# Patient Record
Sex: Female | Born: 1950
Health system: Southern US, Community
[De-identification: ages and names within clinical notes are randomized; demographics above are authoritative.]

## PROBLEM LIST (undated history)

## (undated) DIAGNOSIS — H9191 Unspecified hearing loss, right ear: Secondary | ICD-10-CM

## (undated) DIAGNOSIS — D333 Benign neoplasm of cranial nerves: Secondary | ICD-10-CM

## (undated) DIAGNOSIS — E079 Disorder of thyroid, unspecified: Secondary | ICD-10-CM

---

## 2014-04-02 DIAGNOSIS — H18832 Recurrent erosion of cornea, left eye: Secondary | ICD-10-CM | POA: Diagnosis not present

## 2014-04-04 DIAGNOSIS — H18832 Recurrent erosion of cornea, left eye: Secondary | ICD-10-CM | POA: Diagnosis not present

## 2014-04-06 DIAGNOSIS — H18832 Recurrent erosion of cornea, left eye: Secondary | ICD-10-CM | POA: Diagnosis not present

## 2014-04-09 DIAGNOSIS — Z85828 Personal history of other malignant neoplasm of skin: Secondary | ICD-10-CM | POA: Diagnosis not present

## 2014-04-09 DIAGNOSIS — L570 Actinic keratosis: Secondary | ICD-10-CM | POA: Diagnosis not present

## 2014-06-26 DIAGNOSIS — Z1231 Encounter for screening mammogram for malignant neoplasm of breast: Secondary | ICD-10-CM | POA: Diagnosis not present

## 2014-06-29 DIAGNOSIS — D333 Benign neoplasm of cranial nerves: Secondary | ICD-10-CM | POA: Diagnosis not present

## 2014-06-29 DIAGNOSIS — Z923 Personal history of irradiation: Secondary | ICD-10-CM | POA: Diagnosis not present

## 2014-09-20 DIAGNOSIS — E039 Hypothyroidism, unspecified: Secondary | ICD-10-CM | POA: Diagnosis not present

## 2014-09-20 DIAGNOSIS — E781 Pure hyperglyceridemia: Secondary | ICD-10-CM | POA: Diagnosis not present

## 2014-09-27 DIAGNOSIS — E039 Hypothyroidism, unspecified: Secondary | ICD-10-CM | POA: Diagnosis not present

## 2014-09-27 DIAGNOSIS — E781 Pure hyperglyceridemia: Secondary | ICD-10-CM | POA: Diagnosis not present

## 2014-09-27 DIAGNOSIS — F411 Generalized anxiety disorder: Secondary | ICD-10-CM | POA: Diagnosis not present

## 2014-10-09 DIAGNOSIS — D225 Melanocytic nevi of trunk: Secondary | ICD-10-CM | POA: Diagnosis not present

## 2014-10-09 DIAGNOSIS — L570 Actinic keratosis: Secondary | ICD-10-CM | POA: Diagnosis not present

## 2014-10-09 DIAGNOSIS — D485 Neoplasm of uncertain behavior of skin: Secondary | ICD-10-CM | POA: Diagnosis not present

## 2014-10-09 DIAGNOSIS — Z85828 Personal history of other malignant neoplasm of skin: Secondary | ICD-10-CM | POA: Diagnosis not present

## 2014-10-09 DIAGNOSIS — L400 Psoriasis vulgaris: Secondary | ICD-10-CM | POA: Diagnosis not present

## 2014-11-12 DIAGNOSIS — H2513 Age-related nuclear cataract, bilateral: Secondary | ICD-10-CM | POA: Diagnosis not present

## 2015-01-03 DIAGNOSIS — H2513 Age-related nuclear cataract, bilateral: Secondary | ICD-10-CM | POA: Diagnosis not present

## 2015-01-11 DIAGNOSIS — H2511 Age-related nuclear cataract, right eye: Secondary | ICD-10-CM | POA: Diagnosis not present

## 2015-02-05 DIAGNOSIS — H2511 Age-related nuclear cataract, right eye: Secondary | ICD-10-CM | POA: Diagnosis not present

## 2015-02-07 DIAGNOSIS — H2512 Age-related nuclear cataract, left eye: Secondary | ICD-10-CM | POA: Diagnosis not present

## 2015-02-07 DIAGNOSIS — H2511 Age-related nuclear cataract, right eye: Secondary | ICD-10-CM | POA: Diagnosis not present

## 2015-02-14 DIAGNOSIS — H2512 Age-related nuclear cataract, left eye: Secondary | ICD-10-CM | POA: Diagnosis not present

## 2015-02-28 DIAGNOSIS — H2512 Age-related nuclear cataract, left eye: Secondary | ICD-10-CM | POA: Diagnosis not present

## 2015-03-13 DIAGNOSIS — E039 Hypothyroidism, unspecified: Secondary | ICD-10-CM | POA: Diagnosis not present

## 2015-04-09 DIAGNOSIS — L400 Psoriasis vulgaris: Secondary | ICD-10-CM | POA: Diagnosis not present

## 2015-04-09 DIAGNOSIS — Z85828 Personal history of other malignant neoplasm of skin: Secondary | ICD-10-CM | POA: Diagnosis not present

## 2015-04-09 DIAGNOSIS — L570 Actinic keratosis: Secondary | ICD-10-CM | POA: Diagnosis not present

## 2015-05-08 DIAGNOSIS — H26491 Other secondary cataract, right eye: Secondary | ICD-10-CM | POA: Diagnosis not present

## 2015-06-10 DIAGNOSIS — H43391 Other vitreous opacities, right eye: Secondary | ICD-10-CM | POA: Diagnosis not present

## 2015-06-27 DIAGNOSIS — Z1231 Encounter for screening mammogram for malignant neoplasm of breast: Secondary | ICD-10-CM | POA: Diagnosis not present

## 2015-08-08 DIAGNOSIS — H43391 Other vitreous opacities, right eye: Secondary | ICD-10-CM | POA: Diagnosis not present

## 2015-09-19 DIAGNOSIS — H43391 Other vitreous opacities, right eye: Secondary | ICD-10-CM | POA: Diagnosis not present

## 2015-11-26 DIAGNOSIS — H43391 Other vitreous opacities, right eye: Secondary | ICD-10-CM | POA: Diagnosis not present

## 2016-01-23 DIAGNOSIS — Z23 Encounter for immunization: Secondary | ICD-10-CM | POA: Diagnosis not present

## 2016-03-18 DIAGNOSIS — E039 Hypothyroidism, unspecified: Secondary | ICD-10-CM | POA: Diagnosis not present

## 2016-03-18 DIAGNOSIS — E781 Pure hyperglyceridemia: Secondary | ICD-10-CM | POA: Diagnosis not present

## 2016-03-25 DIAGNOSIS — E781 Pure hyperglyceridemia: Secondary | ICD-10-CM | POA: Diagnosis not present

## 2016-03-25 DIAGNOSIS — Z6830 Body mass index (BMI) 30.0-30.9, adult: Secondary | ICD-10-CM | POA: Diagnosis not present

## 2016-03-25 DIAGNOSIS — E039 Hypothyroidism, unspecified: Secondary | ICD-10-CM | POA: Diagnosis not present

## 2016-03-25 DIAGNOSIS — F411 Generalized anxiety disorder: Secondary | ICD-10-CM | POA: Diagnosis not present

## 2016-04-06 DIAGNOSIS — H26492 Other secondary cataract, left eye: Secondary | ICD-10-CM | POA: Diagnosis not present

## 2016-04-06 DIAGNOSIS — H04123 Dry eye syndrome of bilateral lacrimal glands: Secondary | ICD-10-CM | POA: Diagnosis not present

## 2016-04-08 DIAGNOSIS — D225 Melanocytic nevi of trunk: Secondary | ICD-10-CM | POA: Diagnosis not present

## 2016-04-08 DIAGNOSIS — L400 Psoriasis vulgaris: Secondary | ICD-10-CM | POA: Diagnosis not present

## 2016-04-08 DIAGNOSIS — D485 Neoplasm of uncertain behavior of skin: Secondary | ICD-10-CM | POA: Diagnosis not present

## 2016-04-08 DIAGNOSIS — Z85828 Personal history of other malignant neoplasm of skin: Secondary | ICD-10-CM | POA: Diagnosis not present

## 2016-04-08 DIAGNOSIS — L570 Actinic keratosis: Secondary | ICD-10-CM | POA: Diagnosis not present

## 2016-04-14 DIAGNOSIS — H43391 Other vitreous opacities, right eye: Secondary | ICD-10-CM | POA: Diagnosis not present

## 2016-04-24 DIAGNOSIS — H26492 Other secondary cataract, left eye: Secondary | ICD-10-CM | POA: Diagnosis not present

## 2016-06-23 DIAGNOSIS — M79671 Pain in right foot: Secondary | ICD-10-CM | POA: Diagnosis not present

## 2016-06-23 DIAGNOSIS — M2041 Other hammer toe(s) (acquired), right foot: Secondary | ICD-10-CM | POA: Diagnosis not present

## 2016-06-23 DIAGNOSIS — M25579 Pain in unspecified ankle and joints of unspecified foot: Secondary | ICD-10-CM | POA: Diagnosis not present

## 2016-07-03 DIAGNOSIS — D333 Benign neoplasm of cranial nerves: Secondary | ICD-10-CM | POA: Diagnosis not present

## 2016-07-09 DIAGNOSIS — Z1231 Encounter for screening mammogram for malignant neoplasm of breast: Secondary | ICD-10-CM | POA: Diagnosis not present

## 2016-09-14 DIAGNOSIS — E039 Hypothyroidism, unspecified: Secondary | ICD-10-CM | POA: Diagnosis not present

## 2016-09-17 DIAGNOSIS — Z23 Encounter for immunization: Secondary | ICD-10-CM | POA: Diagnosis not present

## 2016-09-17 DIAGNOSIS — Z6830 Body mass index (BMI) 30.0-30.9, adult: Secondary | ICD-10-CM | POA: Diagnosis not present

## 2016-09-17 DIAGNOSIS — E039 Hypothyroidism, unspecified: Secondary | ICD-10-CM | POA: Diagnosis not present

## 2016-09-17 DIAGNOSIS — E781 Pure hyperglyceridemia: Secondary | ICD-10-CM | POA: Diagnosis not present

## 2016-09-17 DIAGNOSIS — Z0000 Encounter for general adult medical examination without abnormal findings: Secondary | ICD-10-CM | POA: Diagnosis not present

## 2016-09-17 DIAGNOSIS — F411 Generalized anxiety disorder: Secondary | ICD-10-CM | POA: Diagnosis not present

## 2016-10-09 DIAGNOSIS — Z1211 Encounter for screening for malignant neoplasm of colon: Secondary | ICD-10-CM | POA: Diagnosis not present

## 2016-10-09 DIAGNOSIS — K5730 Diverticulosis of large intestine without perforation or abscess without bleeding: Secondary | ICD-10-CM | POA: Diagnosis not present

## 2017-02-11 DIAGNOSIS — R05 Cough: Secondary | ICD-10-CM | POA: Diagnosis not present

## 2017-02-11 DIAGNOSIS — J189 Pneumonia, unspecified organism: Secondary | ICD-10-CM | POA: Diagnosis not present

## 2017-02-11 DIAGNOSIS — R062 Wheezing: Secondary | ICD-10-CM | POA: Diagnosis not present

## 2017-02-11 DIAGNOSIS — R0602 Shortness of breath: Secondary | ICD-10-CM | POA: Diagnosis not present

## 2017-02-11 DIAGNOSIS — R509 Fever, unspecified: Secondary | ICD-10-CM | POA: Diagnosis not present

## 2017-02-12 DIAGNOSIS — R062 Wheezing: Secondary | ICD-10-CM | POA: Diagnosis not present

## 2017-02-12 DIAGNOSIS — J189 Pneumonia, unspecified organism: Secondary | ICD-10-CM | POA: Diagnosis not present

## 2017-02-12 DIAGNOSIS — R509 Fever, unspecified: Secondary | ICD-10-CM | POA: Diagnosis not present

## 2017-03-12 DIAGNOSIS — E039 Hypothyroidism, unspecified: Secondary | ICD-10-CM | POA: Diagnosis not present

## 2017-03-12 DIAGNOSIS — F411 Generalized anxiety disorder: Secondary | ICD-10-CM | POA: Diagnosis not present

## 2017-03-12 DIAGNOSIS — E781 Pure hyperglyceridemia: Secondary | ICD-10-CM | POA: Diagnosis not present

## 2017-03-16 DIAGNOSIS — R739 Hyperglycemia, unspecified: Secondary | ICD-10-CM | POA: Diagnosis not present

## 2017-03-16 DIAGNOSIS — J189 Pneumonia, unspecified organism: Secondary | ICD-10-CM | POA: Diagnosis not present

## 2017-03-16 DIAGNOSIS — E039 Hypothyroidism, unspecified: Secondary | ICD-10-CM | POA: Diagnosis not present

## 2017-03-16 DIAGNOSIS — Z6830 Body mass index (BMI) 30.0-30.9, adult: Secondary | ICD-10-CM | POA: Diagnosis not present

## 2017-03-16 DIAGNOSIS — E781 Pure hyperglyceridemia: Secondary | ICD-10-CM | POA: Diagnosis not present

## 2017-03-16 DIAGNOSIS — F411 Generalized anxiety disorder: Secondary | ICD-10-CM | POA: Diagnosis not present

## 2017-03-16 DIAGNOSIS — Z23 Encounter for immunization: Secondary | ICD-10-CM | POA: Diagnosis not present

## 2017-03-16 DIAGNOSIS — M7551 Bursitis of right shoulder: Secondary | ICD-10-CM | POA: Diagnosis not present

## 2017-04-07 DIAGNOSIS — D485 Neoplasm of uncertain behavior of skin: Secondary | ICD-10-CM | POA: Diagnosis not present

## 2017-04-07 DIAGNOSIS — L821 Other seborrheic keratosis: Secondary | ICD-10-CM | POA: Diagnosis not present

## 2017-04-07 DIAGNOSIS — Z85828 Personal history of other malignant neoplasm of skin: Secondary | ICD-10-CM | POA: Diagnosis not present

## 2017-04-07 DIAGNOSIS — D2362 Other benign neoplasm of skin of left upper limb, including shoulder: Secondary | ICD-10-CM | POA: Diagnosis not present

## 2017-04-07 DIAGNOSIS — L400 Psoriasis vulgaris: Secondary | ICD-10-CM | POA: Diagnosis not present

## 2017-04-07 DIAGNOSIS — L570 Actinic keratosis: Secondary | ICD-10-CM | POA: Diagnosis not present

## 2017-04-07 DIAGNOSIS — L432 Lichenoid drug reaction: Secondary | ICD-10-CM | POA: Diagnosis not present

## 2017-05-06 DIAGNOSIS — Z961 Presence of intraocular lens: Secondary | ICD-10-CM | POA: Diagnosis not present

## 2017-07-13 DIAGNOSIS — Z1231 Encounter for screening mammogram for malignant neoplasm of breast: Secondary | ICD-10-CM | POA: Diagnosis not present

## 2017-09-10 DIAGNOSIS — F411 Generalized anxiety disorder: Secondary | ICD-10-CM | POA: Diagnosis not present

## 2017-09-10 DIAGNOSIS — E781 Pure hyperglyceridemia: Secondary | ICD-10-CM | POA: Diagnosis not present

## 2017-09-10 DIAGNOSIS — R739 Hyperglycemia, unspecified: Secondary | ICD-10-CM | POA: Diagnosis not present

## 2017-09-10 DIAGNOSIS — E039 Hypothyroidism, unspecified: Secondary | ICD-10-CM | POA: Diagnosis not present

## 2017-10-12 DIAGNOSIS — Z85820 Personal history of malignant melanoma of skin: Secondary | ICD-10-CM | POA: Diagnosis not present

## 2017-10-12 DIAGNOSIS — L570 Actinic keratosis: Secondary | ICD-10-CM | POA: Diagnosis not present

## 2017-10-12 DIAGNOSIS — L409 Psoriasis, unspecified: Secondary | ICD-10-CM | POA: Diagnosis not present

## 2017-10-13 DIAGNOSIS — E781 Pure hyperglyceridemia: Secondary | ICD-10-CM | POA: Diagnosis not present

## 2017-10-13 DIAGNOSIS — M7551 Bursitis of right shoulder: Secondary | ICD-10-CM | POA: Diagnosis not present

## 2017-10-13 DIAGNOSIS — R739 Hyperglycemia, unspecified: Secondary | ICD-10-CM | POA: Diagnosis not present

## 2017-10-13 DIAGNOSIS — E039 Hypothyroidism, unspecified: Secondary | ICD-10-CM | POA: Diagnosis not present

## 2017-10-13 DIAGNOSIS — Z6830 Body mass index (BMI) 30.0-30.9, adult: Secondary | ICD-10-CM | POA: Diagnosis not present

## 2017-10-13 DIAGNOSIS — F411 Generalized anxiety disorder: Secondary | ICD-10-CM | POA: Diagnosis not present

## 2017-10-13 DIAGNOSIS — Z0001 Encounter for general adult medical examination with abnormal findings: Secondary | ICD-10-CM | POA: Diagnosis not present

## 2018-03-10 ENCOUNTER — Emergency Department (HOSPITAL_COMMUNITY): Payer: Medicare Other

## 2018-03-10 ENCOUNTER — Emergency Department (HOSPITAL_COMMUNITY)
Admission: EM | Admit: 2018-03-10 | Discharge: 2018-03-10 | Disposition: A | Discharge status: Home / Self Care | Payer: Medicare Other | Attending: Emergency Medicine | Admitting: Emergency Medicine

## 2018-03-10 ENCOUNTER — Other Ambulatory Visit: Payer: Self-pay

## 2018-03-10 ENCOUNTER — Encounter (HOSPITAL_COMMUNITY): Payer: Self-pay | Admitting: Emergency Medicine

## 2018-03-10 DIAGNOSIS — E039 Hypothyroidism, unspecified: Secondary | ICD-10-CM | POA: Insufficient documentation

## 2018-03-10 DIAGNOSIS — K760 Fatty (change of) liver, not elsewhere classified: Secondary | ICD-10-CM | POA: Diagnosis not present

## 2018-03-10 DIAGNOSIS — R079 Chest pain, unspecified: Secondary | ICD-10-CM | POA: Diagnosis not present

## 2018-03-10 DIAGNOSIS — R1013 Epigastric pain: Secondary | ICD-10-CM | POA: Insufficient documentation

## 2018-03-10 DIAGNOSIS — R0789 Other chest pain: Secondary | ICD-10-CM | POA: Insufficient documentation

## 2018-03-10 DIAGNOSIS — Z79899 Other long term (current) drug therapy: Secondary | ICD-10-CM | POA: Diagnosis not present

## 2018-03-10 DIAGNOSIS — K8020 Calculus of gallbladder without cholecystitis without obstruction: Secondary | ICD-10-CM | POA: Insufficient documentation

## 2018-03-10 DIAGNOSIS — R05 Cough: Secondary | ICD-10-CM | POA: Diagnosis not present

## 2018-03-10 HISTORY — DX: Unspecified hearing loss, right ear: H91.91

## 2018-03-10 HISTORY — DX: Benign neoplasm of cranial nerves: D33.3

## 2018-03-10 HISTORY — DX: Disorder of thyroid, unspecified: E07.9

## 2018-03-10 LAB — COMPREHENSIVE METABOLIC PANEL
ALT: 19 U/L (ref 0–44)
AST: 26 U/L (ref 15–41)
Albumin: 3.9 g/dL (ref 3.5–5.0)
Alkaline Phosphatase: 64 U/L (ref 38–126)
Anion gap: 9 (ref 5–15)
BUN: 11 mg/dL (ref 8–23)
CO2: 24 mmol/L (ref 22–32)
Calcium: 9.4 mg/dL (ref 8.9–10.3)
Chloride: 108 mmol/L (ref 98–111)
Creatinine, Ser: 0.82 mg/dL (ref 0.44–1.00)
GFR calc non Af Amer: >60 mL/min (ref >60)
Glucose, Bld: 118 mg/dL — ABNORMAL HIGH (ref 70–99)
Potassium: 4.2 mmol/L (ref 3.5–5.1)
Sodium: 141 mmol/L (ref 135–145)
Total Bilirubin: 0.6 mg/dL (ref 0.3–1.2)
Total Protein: 6.9 g/dL (ref 6.5–8.1)

## 2018-03-10 LAB — CBC WITH DIFFERENTIAL/PLATELET
Abs Immature Granulocytes: 0.01 10*3/uL (ref 0.00–0.07)
BASOS PCT: 1 %
Basophils Absolute: 0 10*3/uL (ref 0.0–0.1)
Eosinophils Absolute: 0.1 10*3/uL (ref 0.0–0.5)
Eosinophils Relative: 2 %
HCT: 44.5 % (ref 36.0–46.0)
Hemoglobin: 14.1 g/dL (ref 12.0–15.0)
Immature Granulocytes: 0 %
Lymphocytes Relative: 29 %
Lymphs Abs: 1.8 10*3/uL (ref 0.7–4.0)
MCH: 29.7 pg (ref 26.0–34.0)
MCHC: 31.7 g/dL (ref 30.0–36.0)
MCV: 93.7 fL (ref 80.0–100.0)
Monocytes Absolute: 0.6 10*3/uL (ref 0.1–1.0)
Monocytes Relative: 10 %
NEUTROS ABS: 3.7 10*3/uL (ref 1.7–7.7)
Neutrophils Relative %: 58 %
Platelets: 270 10*3/uL (ref 150–400)
RBC: 4.75 MIL/uL (ref 3.87–5.11)
RDW: 12.7 % (ref 11.5–15.5)
WBC: 6.2 10*3/uL (ref 4.0–10.5)
nRBC: 0 % (ref 0.0–0.2)

## 2018-03-10 LAB — I-STAT TROPONIN, ED
Troponin i, poc: 0.01 ng/mL (ref 0.00–0.08)
Troponin i, poc: 0 ng/mL (ref 0.00–0.08)

## 2018-03-10 LAB — LIPASE, BLOOD: Lipase: 29 U/L (ref 11–51)

## 2018-03-10 LAB — MAGNESIUM: Magnesium: 2.4 mg/dL (ref 1.7–2.4)

## 2018-03-10 NOTE — ED Triage Notes (Signed)
Pt with chest pain that has been ongoing for a few weeks. Denies cardiac hx sob. Pt is a/o x4. Swarm in progress.

## 2018-03-10 NOTE — ED Notes (Signed)
Pt verbalized understanding of discharge instructions and denies any further questions at this time.   

## 2018-03-10 NOTE — Discharge Instructions (Signed)
You were seen in the ER for central chest pain.   Work-up today shows gallstones which could be contributing to your symptoms.  Further evaluation of gallstones is done by general surgery.  Given your cardiac risk factors, work-up, you are considered appropriate for discharge with continued outpatient cardiology follow-up for further evaluation.  You need to follow-up with cardiology within the next 30 days.  Return to the ER for chest pain or shortness of breath with exertion, sweats, nausea, vomiting, palpitations, radiation of pain into your jaw or arm.

## 2018-03-10 NOTE — ED Provider Notes (Signed)
[facility] EMERGENCY DEPARTMENT Provider Note   CSN: [id number] Arrival date & time: 03/10/18  1129     History   Chief Complaint Chief Complaint  Patient presents with  . Chest Pain    HPI Diane Graham is a 67 y.o. female with history of hypothyroidism on Synthroid is here for evaluation of chest tightness described as somebody shoving their fingers into her chest.  She points to her low sternum/epigastrium.  She called her PCP (Dr Quintin Alto at [facility] in [place]) and was told to come to the ER.  States that her chest discomfort has been intermittent for the last month. The chest discomfort happens more frequently while at rest, randomly and sometimes after eating.  Sometimes she wakes up with it.  Last chest discomfort at 930 am today.  Has noted increased belching, early satiety, bloating, dry coughing as well.  Sometimes she feels a "pressure" to her left wrist, left groin, left foot with the chest discomfort.  Her chest discomfort last a few minutes and spontaneously resolves without any intervention.  Chest discomfort is nonexertional, nonpleuritic.  Associated with lightheadedness described as intermittent, in bilateral eyes, "spinning" vision that she describes as looking through a kaleidoscope.  Also sometimes feels her heart beating fast and tingling in her lips.  Her mother has had cardiac disease but cannot tell me at what age.  No history of tobacco use, EtOH use, diabetes, hypertension, hyperlipidemia.  No interventions.  No alleviating or aggravating factors. Denies associated fever, SOB, exertional CP, peripheral edema, calf pain, orthopnea. No dVT/PE in the past, no LE edema or pain, recent prolonged travel or immobilization or surgery.  HPI  Past Medical History:  Diagnosis Date  . Acoustic neuroma ([place])   . Deaf, right   . Thyroid disease     There are no active problems to display for this patient.   History reviewed. No pertinent surgical  history.   OB History   No obstetric history on file.      Home Medications    Prior to Admission medications   Not on File    Family History No family history on file.  Social History Social History   Tobacco Use  . Smoking status: Never Smoker  . Smokeless tobacco: Never Used  Substance Use Topics  . Alcohol use: Not Currently  . Drug use: Not Currently     Allergies   Patient has no known allergies.   Review of Systems Review of Systems  Eyes: Positive for visual disturbance.  Cardiovascular: Positive for palpitations.       Chest discomfort   Gastrointestinal:       Belching   Musculoskeletal: Positive for arthralgias (left wrist, groin, foot).  Neurological: Positive for light-headedness.       Lip tingling      Physical Exam Updated Vital Signs BP 124/64 (BP Location: Left Arm)   Pulse 73   Temp 98.1 F (36.7 C) (Oral)   Resp 16   Ht 5' 4.5" (1.638 m)   Wt 79.4 kg   SpO2 97%   BMI 29.57 kg/m   Physical Exam Vitals signs and nursing note reviewed.  Constitutional:      Appearance: She is well-developed.     Comments: Non toxic  HENT:     Head: Normocephalic and atraumatic.     Nose: Nose normal.  Eyes:     Extraocular Movements: Extraocular movements intact.     Conjunctiva/sclera: Conjunctivae normal.  Pupils: Pupils are equal, round, and reactive to light.  Neck:     Musculoskeletal: Normal range of motion.  Cardiovascular:     Rate and Rhythm: Normal rate and regular rhythm.     Pulses:          Carotid pulses are 1+ on the right side and 1+ on the left side.      Dorsalis pedis pulses are 1+ on the right side and 1+ on the left side.     Heart sounds: Normal heart sounds. No murmur.     Comments: No LE edema or calf tenderness  Pulmonary:     Effort: Pulmonary effort is normal.     Breath sounds: Normal breath sounds.  Abdominal:     General: Bowel sounds are normal.     Palpations: Abdomen is soft.     Tenderness:  There is no abdominal tenderness.     Comments: No G/R/R. No suprapubic or CVA tenderness. Negative Murphy's and McBurney's. Active BS to lower quadrants.   Musculoskeletal: Normal range of motion.  Skin:    General: Skin is warm and dry.     Capillary Refill: Capillary refill takes less than 2 seconds.  Neurological:     Mental Status: She is alert and oriented to person, place, and time.  Psychiatric:        Behavior: Behavior normal.      ED Treatments / Results  Labs (all labs ordered are listed, but only abnormal results are displayed) Labs Reviewed  COMPREHENSIVE METABOLIC PANEL - Abnormal; Notable for the following components:      Result Value   Glucose, Bld 118 (*)    All other components within normal limits  LIPASE, BLOOD  MAGNESIUM  CBC WITH DIFFERENTIAL/PLATELET  CBC WITH DIFFERENTIAL/PLATELET  TSH  T4  I-STAT TROPONIN, ED  I-STAT TROPONIN, ED    EKG EKG Interpretation  Date/Time:  Thursday March 10 2018 12:55:31 EST Ventricular Rate:  64 PR Interval:  142 QRS Duration: 76 QT Interval:  408 QTC Calculation: 420 R Axis:   61 Text Interpretation:  Normal sinus rhythm Low voltage QRS Borderline ECG no interval change from earlier. Confirmed by Charlesetta Shanks [phone]) on 03/10/2018 2:05:53 PM   Radiology Dg Chest 2 View  Result Date: 03/10/2018 CLINICAL DATA:  Cough and central chest pain over the last several weeks. EXAM: CHEST - 2 VIEW COMPARISON:  None. FINDINGS: Heart size is normal. Mediastinal shadows are normal. The pulmonary vascularity is normal. The lungs are probably clear. There is a density overlying the right upper lung this most consistent with a monitor snap. If that is not in place, this could represent a pulmonary nodule or skin lesion. No effusions. No significant bone finding. Multiple small gallstones noted. IMPRESSION: Probably no active cardiopulmonary disease. Favor snap artifact overlying the right upper chest. See above.  Chololithiasis. Electronically Signed   By: Nelson Chimes M.D.   On: 03/10/2018 13:22   US Abdomen Limited Ruq  Result Date: 03/10/2018 CLINICAL DATA:  Epigastric pain EXAM: ULTRASOUND ABDOMEN LIMITED RIGHT UPPER QUADRANT COMPARISON:  None. FINDINGS: Gallbladder: Gallbladder is well distended. Multiple gallstones are seen. No wall thickening or pericholecystic fluid is noted. Common bile duct: Diameter: 2.9 mm. Liver: Mild increased echogenicity is noted consistent with fatty infiltration. No focal mass is noted. Portal vein is patent on color Doppler imaging with normal direction of blood flow towards the liver. IMPRESSION: Cholelithiasis without complicating factors. Fatty liver. Electronically Signed   By: Elta Guadeloupe  Lukens M.D.   On: 03/10/2018 14:57    Procedures Procedures (including critical care time)  Medications Ordered in ED Medications - No data to display   Initial Impression / Assessment and Plan / ED Course  I have reviewed the triage vital signs and the nursing notes.  Pertinent labs & imaging results that were available during my care of the patient were reviewed by me and considered in my medical decision making (see chart for details).  Clinical Course as of Mar 10 1612  Thu Mar 10, 2018  1509 Liver:  Mild increased echogenicity is noted consistent with fatty infiltration. No focal mass is noted. Portal vein is patent on color Doppler imaging with normal direction of blood flow towards the liver.  IMPRESSION: Cholelithiasis without complicating factors.  Fatty liver.   Electronically Signed By: Inez Catalina M.D. On: 03/10/2018 14:5  US Abdomen Limited RUQ [CG]    Clinical Course User Index [CG] Kinnie Feil, PA-C   Atypical CP.  Also considering GI etiology such as GERD vs PUD vs gastritis vs cholelithiasis or biliary colic.  Cardiac risk factors include age, elevated BMI. HEART score < 3.   1405: On re-evaluation pt reported chest discomfort  returned, repeat EKG unchanged. Cholelithiasis noted on CXR, this could explain or contribute to symptomatology. We will obtain RUQ [place], pending delta trop.   1611: Delta troponin flat.  Ultrasound shows multiple gallstones and mild fatty liver.  Patient has had 2 EKGs unchanged.  I discussed work-up with patient and husband.  Given cardiac risk factors, heart score, work-up in the ER she is considered appropriate for outpatient follow-up with cardiology for further evaluation of chest pain.  Her gallstones could be contributing to her discomfort.  I doubt PE in this patient as she has no risk factors, tachycardia, tachypnea, clinical findings concerning for blood clot.  She will be discharged with cardiology follow-up in general surgery follow-up for further discussion of gallstones.  Return precautions given.  Patient is comfortable with this plan.  Discussed with MD.  Final Clinical Impressions(s) / ED Diagnoses   Final diagnoses:  Epigastric abdominal pain  Atypical chest pain  Calculus of gallbladder without cholecystitis without obstruction    ED Discharge Orders    None       Kinnie Feil, PA-C 03/10/18 1613    Charlesetta Shanks, MD 03/19/18 1554

## 2018-03-10 NOTE — ED Notes (Signed)
Pt given a beverage and is awaiting disposition.

## 2018-03-16 DIAGNOSIS — K8020 Calculus of gallbladder without cholecystitis without obstruction: Secondary | ICD-10-CM | POA: Diagnosis not present

## 2018-04-11 DIAGNOSIS — R739 Hyperglycemia, unspecified: Secondary | ICD-10-CM | POA: Diagnosis not present

## 2018-04-11 DIAGNOSIS — E039 Hypothyroidism, unspecified: Secondary | ICD-10-CM | POA: Diagnosis not present

## 2018-04-11 DIAGNOSIS — E781 Pure hyperglyceridemia: Secondary | ICD-10-CM | POA: Diagnosis not present

## 2018-04-12 DIAGNOSIS — Z85820 Personal history of malignant melanoma of skin: Secondary | ICD-10-CM | POA: Diagnosis not present

## 2018-04-12 DIAGNOSIS — L400 Psoriasis vulgaris: Secondary | ICD-10-CM | POA: Diagnosis not present

## 2018-04-12 DIAGNOSIS — L570 Actinic keratosis: Secondary | ICD-10-CM | POA: Diagnosis not present

## 2018-04-13 DIAGNOSIS — F411 Generalized anxiety disorder: Secondary | ICD-10-CM | POA: Diagnosis not present

## 2018-04-13 DIAGNOSIS — Z6830 Body mass index (BMI) 30.0-30.9, adult: Secondary | ICD-10-CM | POA: Diagnosis not present

## 2018-04-13 DIAGNOSIS — E039 Hypothyroidism, unspecified: Secondary | ICD-10-CM | POA: Diagnosis not present

## 2018-04-13 DIAGNOSIS — E781 Pure hyperglyceridemia: Secondary | ICD-10-CM | POA: Diagnosis not present

## 2018-04-13 DIAGNOSIS — R739 Hyperglycemia, unspecified: Secondary | ICD-10-CM | POA: Diagnosis not present

## 2018-04-13 DIAGNOSIS — K8020 Calculus of gallbladder without cholecystitis without obstruction: Secondary | ICD-10-CM | POA: Diagnosis not present

## 2018-04-26 DIAGNOSIS — Z6830 Body mass index (BMI) 30.0-30.9, adult: Secondary | ICD-10-CM | POA: Diagnosis not present

## 2018-04-26 DIAGNOSIS — S8012XA Contusion of left lower leg, initial encounter: Secondary | ICD-10-CM | POA: Diagnosis not present

## 2018-08-26 DIAGNOSIS — Z1231 Encounter for screening mammogram for malignant neoplasm of breast: Secondary | ICD-10-CM | POA: Diagnosis not present

## 2018-09-20 DIAGNOSIS — D333 Benign neoplasm of cranial nerves: Secondary | ICD-10-CM | POA: Diagnosis not present

## 2018-10-04 DIAGNOSIS — Z85828 Personal history of other malignant neoplasm of skin: Secondary | ICD-10-CM | POA: Diagnosis not present

## 2018-10-04 DIAGNOSIS — C4401 Basal cell carcinoma of skin of lip: Secondary | ICD-10-CM | POA: Diagnosis not present

## 2018-10-04 DIAGNOSIS — L570 Actinic keratosis: Secondary | ICD-10-CM | POA: Diagnosis not present

## 2018-10-04 DIAGNOSIS — L819 Disorder of pigmentation, unspecified: Secondary | ICD-10-CM | POA: Diagnosis not present

## 2018-10-04 DIAGNOSIS — D485 Neoplasm of uncertain behavior of skin: Secondary | ICD-10-CM | POA: Diagnosis not present

## 2018-10-17 DIAGNOSIS — E039 Hypothyroidism, unspecified: Secondary | ICD-10-CM | POA: Diagnosis not present

## 2018-10-17 DIAGNOSIS — R739 Hyperglycemia, unspecified: Secondary | ICD-10-CM | POA: Diagnosis not present

## 2018-10-17 DIAGNOSIS — E781 Pure hyperglyceridemia: Secondary | ICD-10-CM | POA: Diagnosis not present

## 2018-10-20 DIAGNOSIS — R739 Hyperglycemia, unspecified: Secondary | ICD-10-CM | POA: Diagnosis not present

## 2018-10-20 DIAGNOSIS — E039 Hypothyroidism, unspecified: Secondary | ICD-10-CM | POA: Diagnosis not present

## 2018-10-20 DIAGNOSIS — K8020 Calculus of gallbladder without cholecystitis without obstruction: Secondary | ICD-10-CM | POA: Diagnosis not present

## 2018-10-20 DIAGNOSIS — E781 Pure hyperglyceridemia: Secondary | ICD-10-CM | POA: Diagnosis not present

## 2018-10-20 DIAGNOSIS — C4401 Basal cell carcinoma of skin of lip: Secondary | ICD-10-CM | POA: Diagnosis not present

## 2018-10-20 DIAGNOSIS — F411 Generalized anxiety disorder: Secondary | ICD-10-CM | POA: Diagnosis not present

## 2018-12-28 DIAGNOSIS — E785 Hyperlipidemia, unspecified: Secondary | ICD-10-CM | POA: Diagnosis not present

## 2018-12-28 DIAGNOSIS — I10 Essential (primary) hypertension: Secondary | ICD-10-CM | POA: Diagnosis not present

## 2019-01-11 DIAGNOSIS — Z23 Encounter for immunization: Secondary | ICD-10-CM | POA: Diagnosis not present

## 2019-02-22 ENCOUNTER — Other Ambulatory Visit: Payer: Self-pay

## 2019-04-05 DIAGNOSIS — Z85828 Personal history of other malignant neoplasm of skin: Secondary | ICD-10-CM | POA: Diagnosis not present

## 2019-04-05 DIAGNOSIS — Z85820 Personal history of malignant melanoma of skin: Secondary | ICD-10-CM | POA: Diagnosis not present

## 2019-04-05 DIAGNOSIS — L570 Actinic keratosis: Secondary | ICD-10-CM | POA: Diagnosis not present

## 2019-04-05 DIAGNOSIS — L409 Psoriasis, unspecified: Secondary | ICD-10-CM | POA: Diagnosis not present

## 2019-04-13 DIAGNOSIS — Z961 Presence of intraocular lens: Secondary | ICD-10-CM | POA: Diagnosis not present

## 2019-04-13 DIAGNOSIS — Z0100 Encounter for examination of eyes and vision without abnormal findings: Secondary | ICD-10-CM | POA: Diagnosis not present

## 2019-04-18 DIAGNOSIS — F411 Generalized anxiety disorder: Secondary | ICD-10-CM | POA: Diagnosis not present

## 2019-04-18 DIAGNOSIS — Z0001 Encounter for general adult medical examination with abnormal findings: Secondary | ICD-10-CM | POA: Diagnosis not present

## 2019-04-18 DIAGNOSIS — R739 Hyperglycemia, unspecified: Secondary | ICD-10-CM | POA: Diagnosis not present

## 2019-04-18 DIAGNOSIS — E039 Hypothyroidism, unspecified: Secondary | ICD-10-CM | POA: Diagnosis not present

## 2019-04-18 DIAGNOSIS — Z6831 Body mass index (BMI) 31.0-31.9, adult: Secondary | ICD-10-CM | POA: Diagnosis not present

## 2019-04-18 DIAGNOSIS — E781 Pure hyperglyceridemia: Secondary | ICD-10-CM | POA: Diagnosis not present

## 2019-04-18 DIAGNOSIS — K8020 Calculus of gallbladder without cholecystitis without obstruction: Secondary | ICD-10-CM | POA: Diagnosis not present

## 2019-04-28 DIAGNOSIS — E039 Hypothyroidism, unspecified: Secondary | ICD-10-CM | POA: Diagnosis not present

## 2019-04-28 DIAGNOSIS — F411 Generalized anxiety disorder: Secondary | ICD-10-CM | POA: Diagnosis not present

## 2019-06-05 DIAGNOSIS — D333 Benign neoplasm of cranial nerves: Secondary | ICD-10-CM | POA: Diagnosis not present

## 2019-06-05 DIAGNOSIS — H905 Unspecified sensorineural hearing loss: Secondary | ICD-10-CM | POA: Diagnosis not present

## 2019-06-05 DIAGNOSIS — H903 Sensorineural hearing loss, bilateral: Secondary | ICD-10-CM | POA: Diagnosis not present

## 2019-06-14 DIAGNOSIS — Z23 Encounter for immunization: Secondary | ICD-10-CM | POA: Diagnosis not present

## 2019-07-12 DIAGNOSIS — Z23 Encounter for immunization: Secondary | ICD-10-CM | POA: Diagnosis not present

## 2019-10-17 DIAGNOSIS — F411 Generalized anxiety disorder: Secondary | ICD-10-CM | POA: Diagnosis not present

## 2019-10-17 DIAGNOSIS — L814 Other melanin hyperpigmentation: Secondary | ICD-10-CM | POA: Diagnosis not present

## 2019-10-17 DIAGNOSIS — E781 Pure hyperglyceridemia: Secondary | ICD-10-CM | POA: Diagnosis not present

## 2019-10-17 DIAGNOSIS — Z85828 Personal history of other malignant neoplasm of skin: Secondary | ICD-10-CM | POA: Diagnosis not present

## 2019-10-17 DIAGNOSIS — R739 Hyperglycemia, unspecified: Secondary | ICD-10-CM | POA: Diagnosis not present

## 2019-10-17 DIAGNOSIS — L570 Actinic keratosis: Secondary | ICD-10-CM | POA: Diagnosis not present

## 2019-10-17 DIAGNOSIS — K8020 Calculus of gallbladder without cholecystitis without obstruction: Secondary | ICD-10-CM | POA: Diagnosis not present

## 2019-10-17 DIAGNOSIS — E039 Hypothyroidism, unspecified: Secondary | ICD-10-CM | POA: Diagnosis not present

## 2019-10-17 DIAGNOSIS — L409 Psoriasis, unspecified: Secondary | ICD-10-CM | POA: Diagnosis not present

## 2019-10-19 DIAGNOSIS — K8020 Calculus of gallbladder without cholecystitis without obstruction: Secondary | ICD-10-CM | POA: Diagnosis not present

## 2019-10-19 DIAGNOSIS — Z6831 Body mass index (BMI) 31.0-31.9, adult: Secondary | ICD-10-CM | POA: Diagnosis not present

## 2019-10-19 DIAGNOSIS — E781 Pure hyperglyceridemia: Secondary | ICD-10-CM | POA: Diagnosis not present

## 2019-10-19 DIAGNOSIS — E039 Hypothyroidism, unspecified: Secondary | ICD-10-CM | POA: Diagnosis not present

## 2019-10-19 DIAGNOSIS — F411 Generalized anxiety disorder: Secondary | ICD-10-CM | POA: Diagnosis not present

## 2019-10-19 DIAGNOSIS — R739 Hyperglycemia, unspecified: Secondary | ICD-10-CM | POA: Diagnosis not present

## 2020-01-28 DIAGNOSIS — Z23 Encounter for immunization: Secondary | ICD-10-CM | POA: Diagnosis not present

## 2020-03-01 ENCOUNTER — Other Ambulatory Visit: Payer: Self-pay | Admitting: Physician Assistant

## 2020-03-01 DIAGNOSIS — K5900 Constipation, unspecified: Secondary | ICD-10-CM | POA: Diagnosis not present

## 2020-03-01 DIAGNOSIS — K760 Fatty (change of) liver, not elsewhere classified: Secondary | ICD-10-CM | POA: Diagnosis not present

## 2020-03-01 DIAGNOSIS — R1011 Right upper quadrant pain: Secondary | ICD-10-CM | POA: Diagnosis not present

## 2020-03-18 ENCOUNTER — Ambulatory Visit
Admission: RE | Admit: 2020-03-18 | Discharge: 2020-03-18 | Disposition: A | Discharge status: Home / Self Care | Payer: Medicare Other | Source: Ambulatory Visit | Attending: Physician Assistant | Admitting: Physician Assistant

## 2020-03-18 DIAGNOSIS — K7689 Other specified diseases of liver: Secondary | ICD-10-CM | POA: Diagnosis not present

## 2020-03-18 DIAGNOSIS — R1011 Right upper quadrant pain: Secondary | ICD-10-CM

## 2020-03-18 DIAGNOSIS — K8020 Calculus of gallbladder without cholecystitis without obstruction: Secondary | ICD-10-CM | POA: Diagnosis not present

## 2020-03-30 HISTORY — PX: CHOLECYSTECTOMY: SHX55

## 2020-05-03 ENCOUNTER — Other Ambulatory Visit: Payer: Self-pay | Admitting: Physician Assistant

## 2020-09-28 IMAGING — US US ABDOMEN LIMITED
1 series · 14 of 25 positions shown · non-contrast
Comparison: None.

CLINICAL DATA: Epigastric pain

EXAM:
ULTRASOUND ABDOMEN LIMITED RIGHT UPPER QUADRANT

[Series 1: us abdomen limited · 0.20mm/px · 14 of 38 slices shown]
[im 1/38]
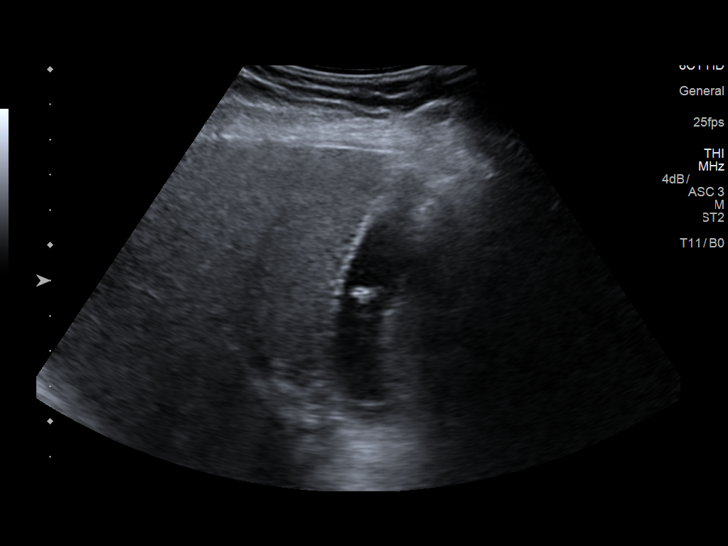
[im 4/38]
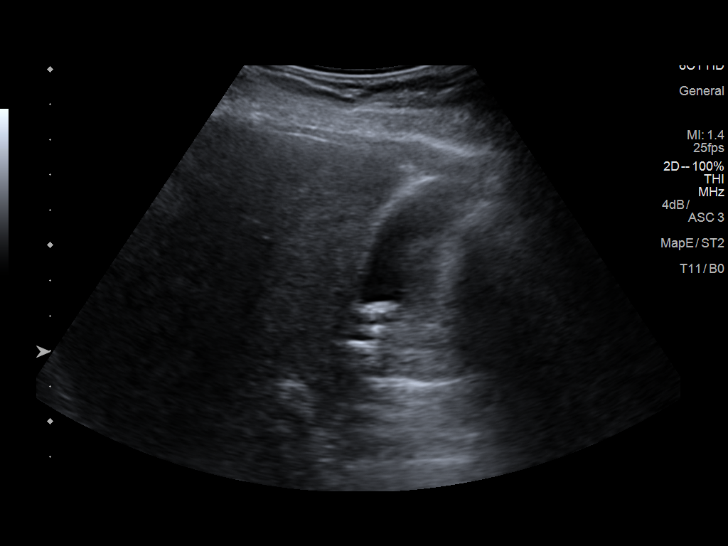
[im 7/38]
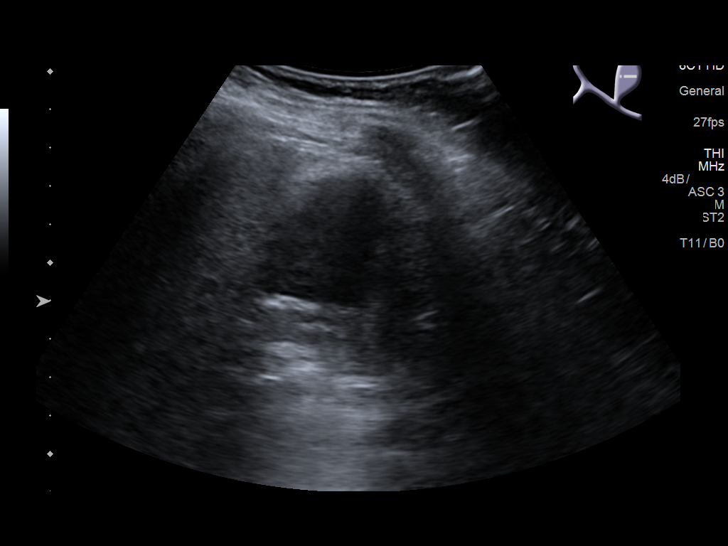
[im 10/38]
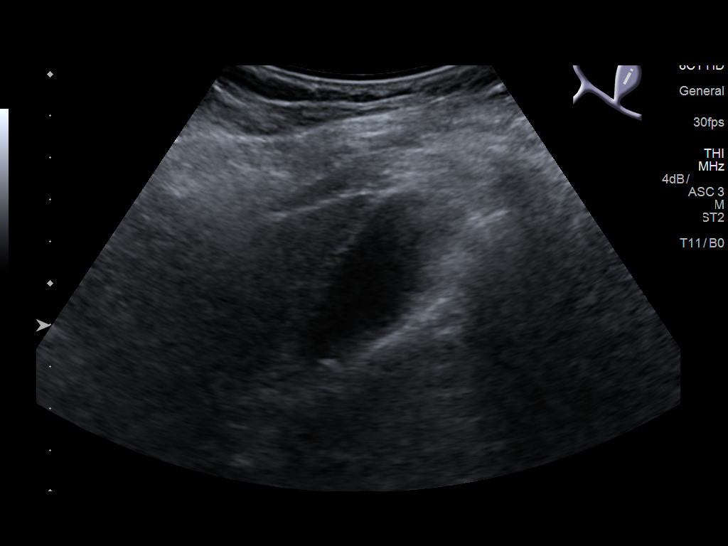
[im 13/38]
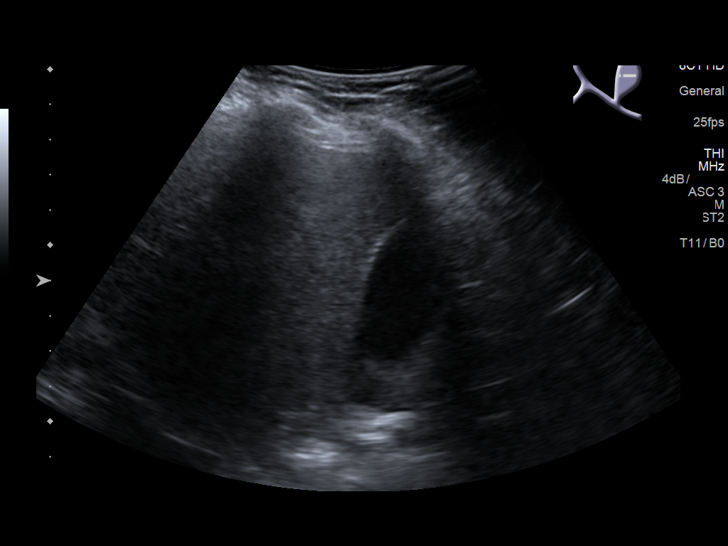
[im 14/38]
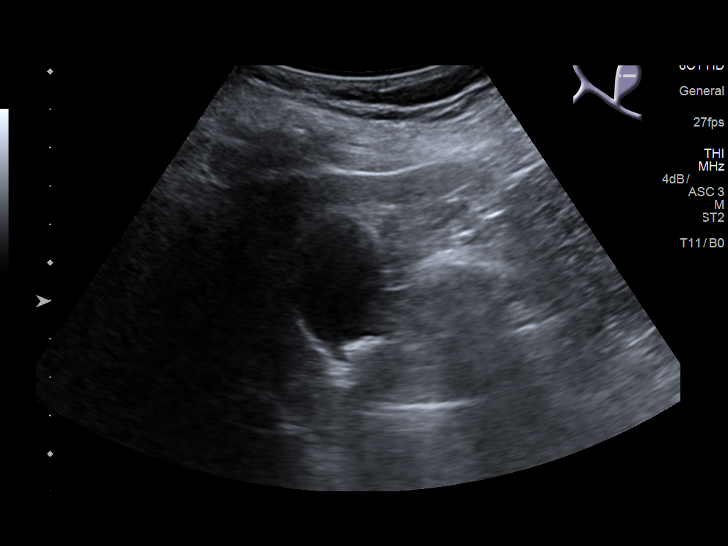
[im 17/38]
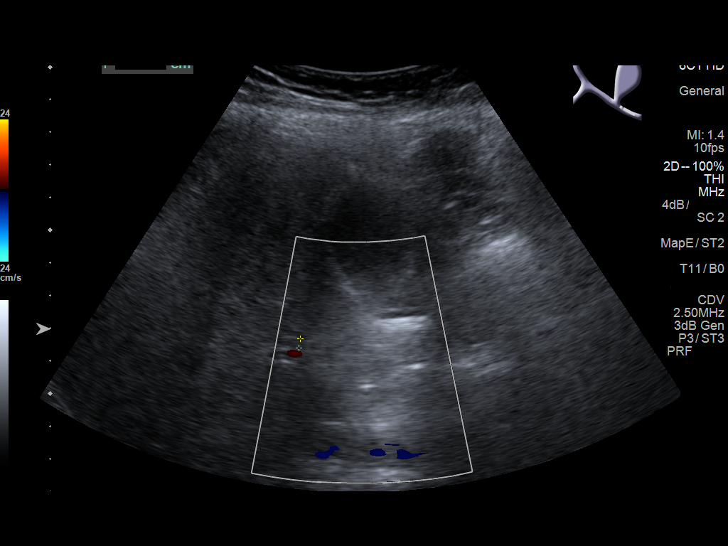
[im 21/38]
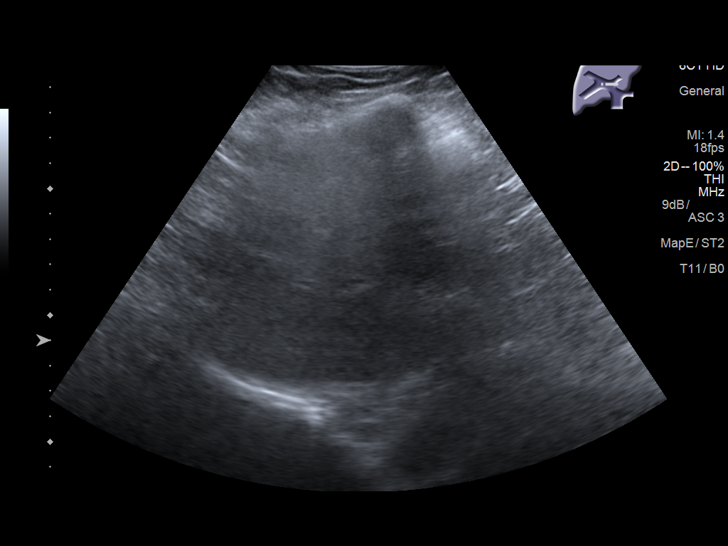
[im 24/38]
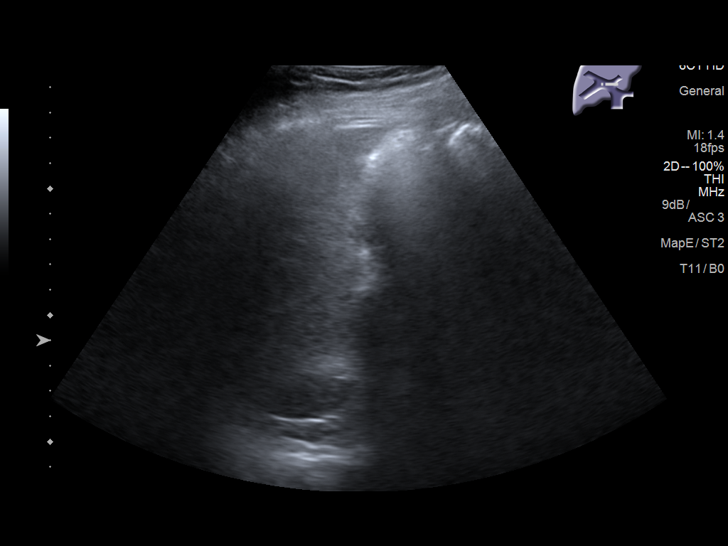
[im 25/38]
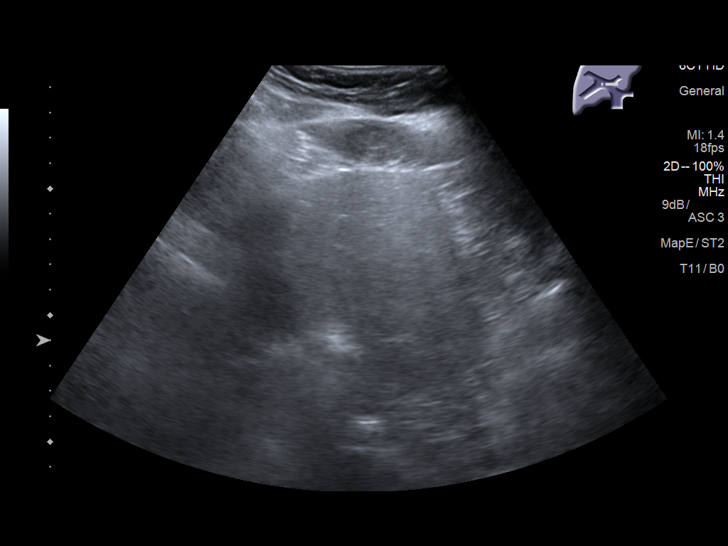
[im 28/38]
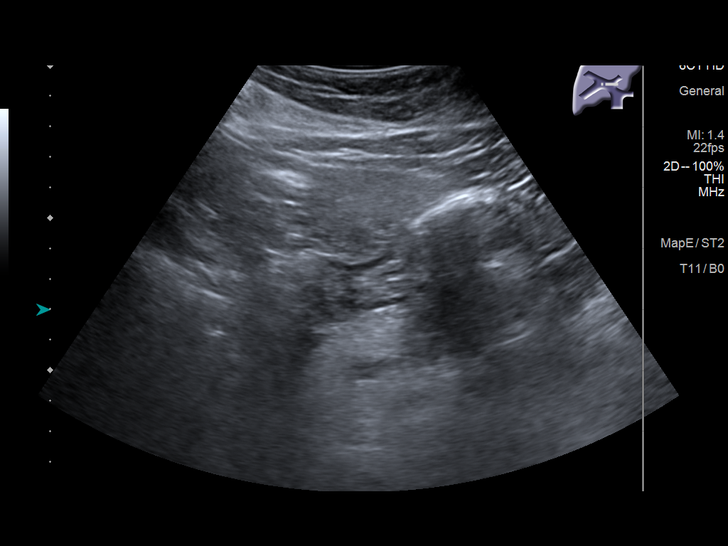
[im 31/38]
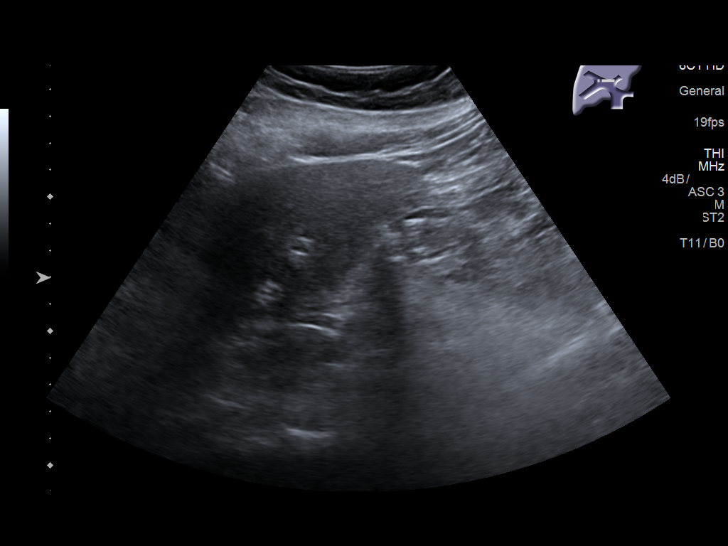
[im 34/38]
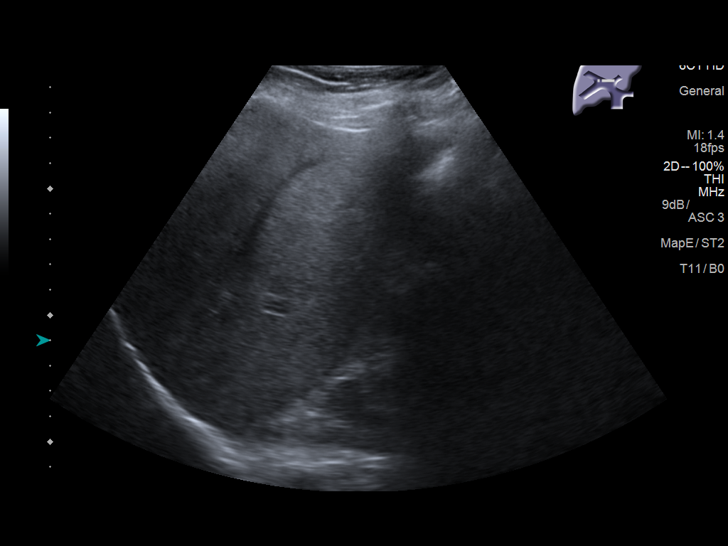
[im 38/38]
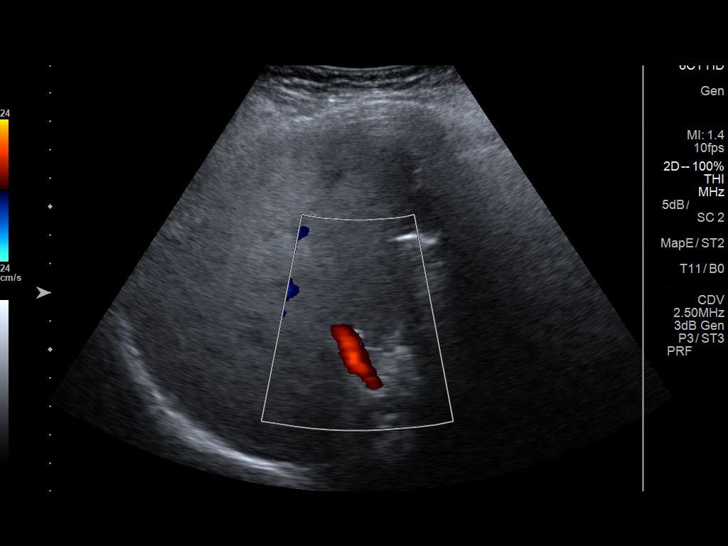

[14 of 25 positions shown; findings below may reference images not displayed]

FINDINGS: Gallbladder:

Gallbladder is well distended. Multiple gallstones are seen. No wall
thickening or pericholecystic fluid is noted.

Common bile duct:

Diameter: 2.9 mm.

Liver:

Mild increased echogenicity is noted consistent with fatty
infiltration. No focal mass is noted. Portal vein is patent on color
Doppler imaging with normal direction of blood flow towards the
liver.
IMPRESSION: Cholelithiasis without complicating factors.

Fatty liver.

## 2022-03-30 IMAGING — US US ABDOMEN COMPLETE
1 series · 13 of 25 positions shown · non-contrast
Comparison: Ultrasound right upper quadrant March 10, 2018.

CLINICAL DATA: Right upper quadrant pain

EXAM:
ABDOMEN ULTRASOUND COMPLETE

[Series 1: us abdomen complete · 0.25mm/px · 13 of 83 slices shown]
[im 1/83]
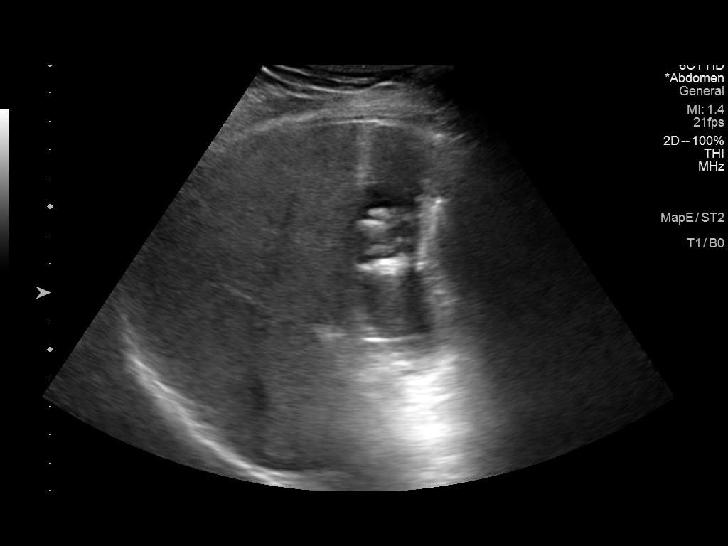
[im 7/83]
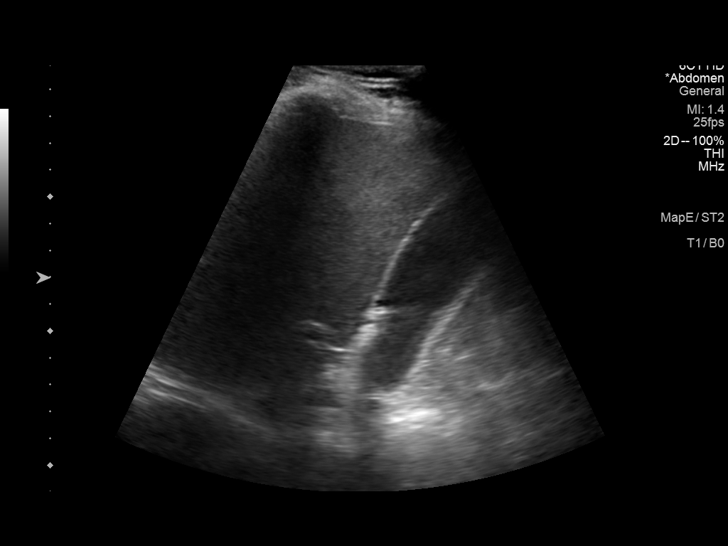
[im 14/83]
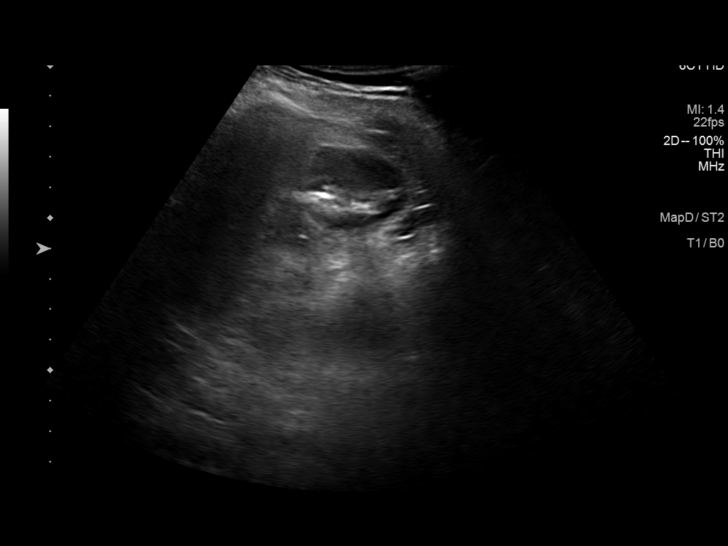
[im 21/83]
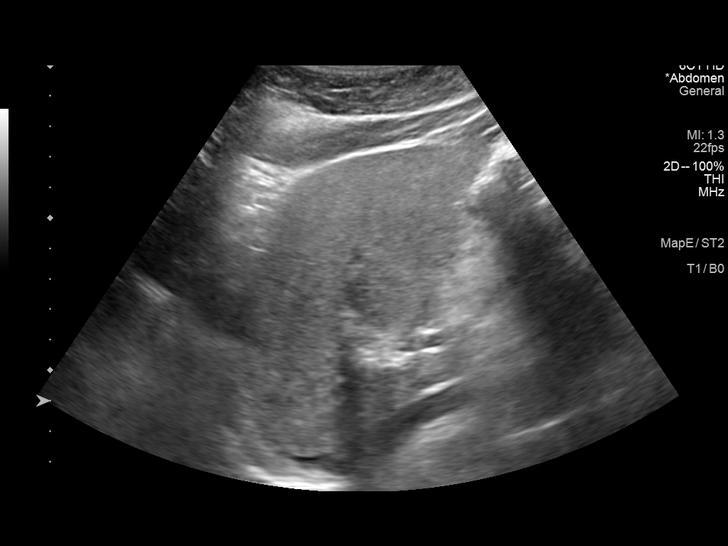
[im 28/83]
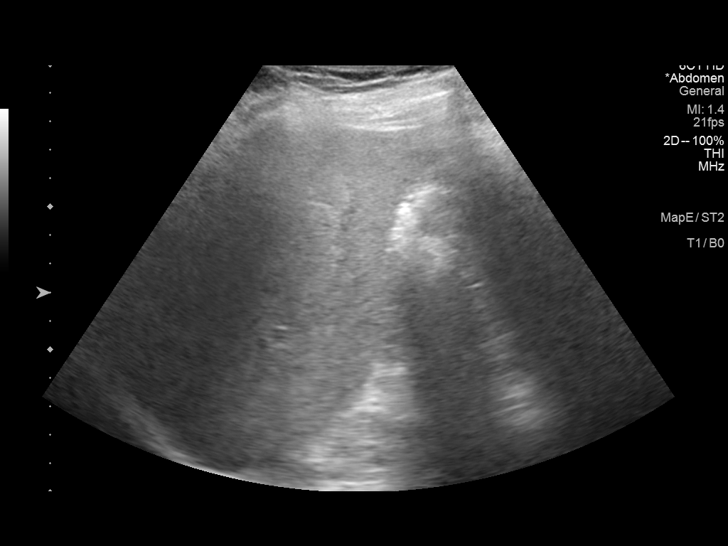
[im 35/83]
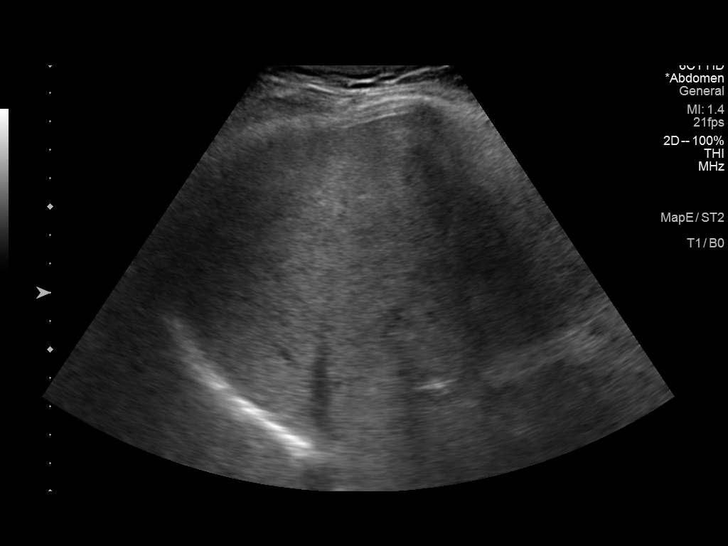
[im 42/83]
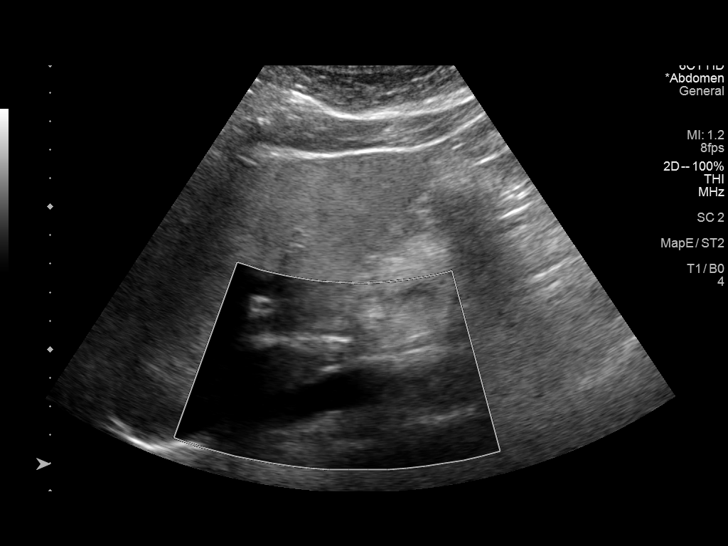
[im 48/83]
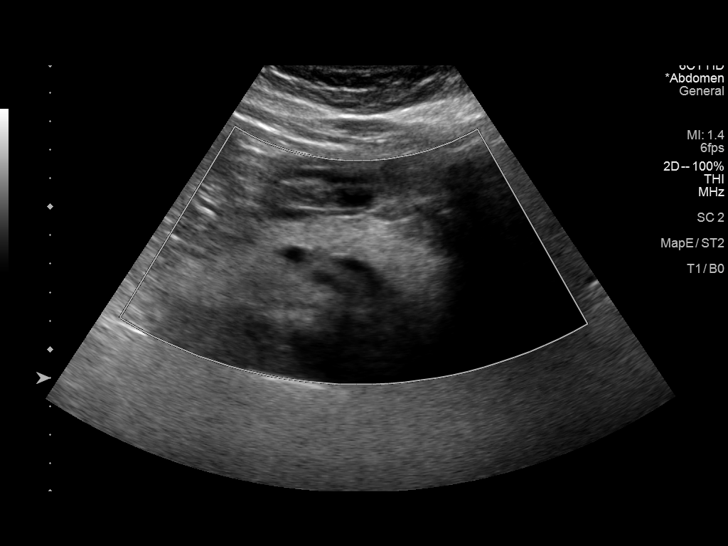
[im 55/83]
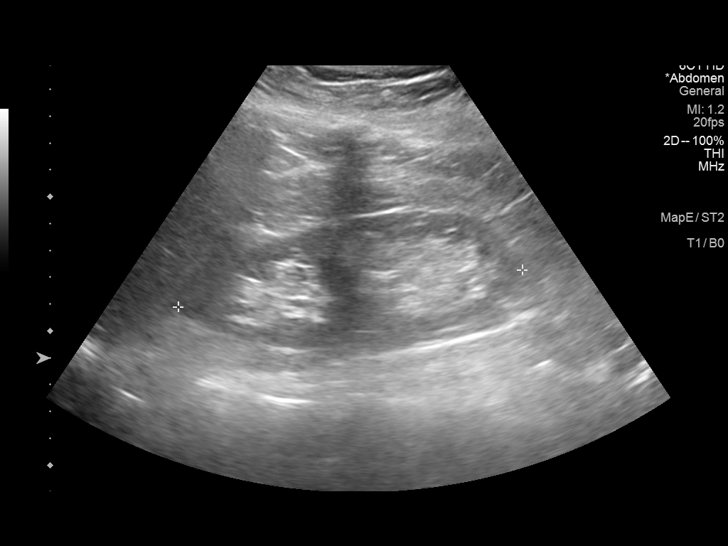
[im 62/83]
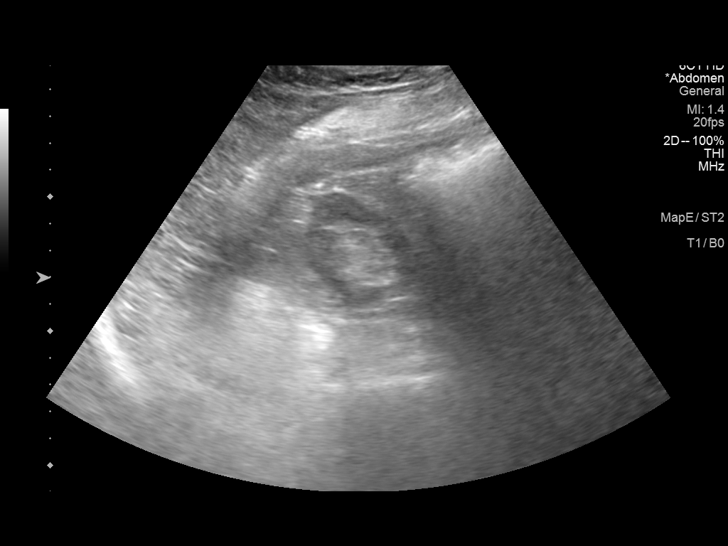
[im 69/83]
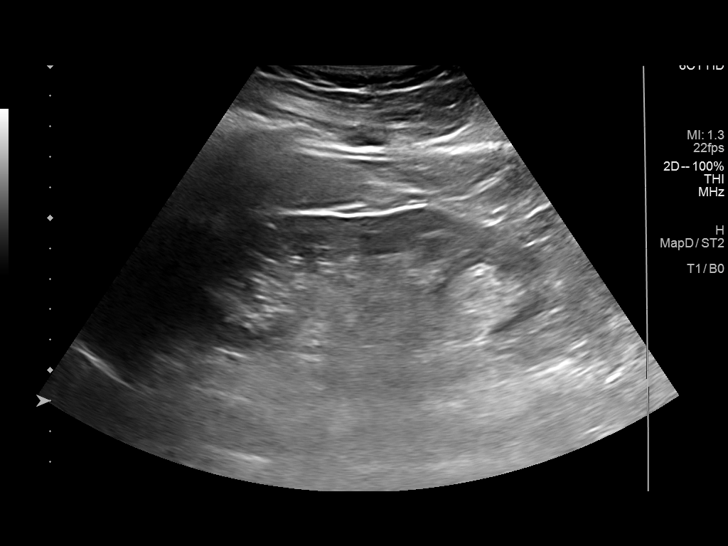
[im 76/83]
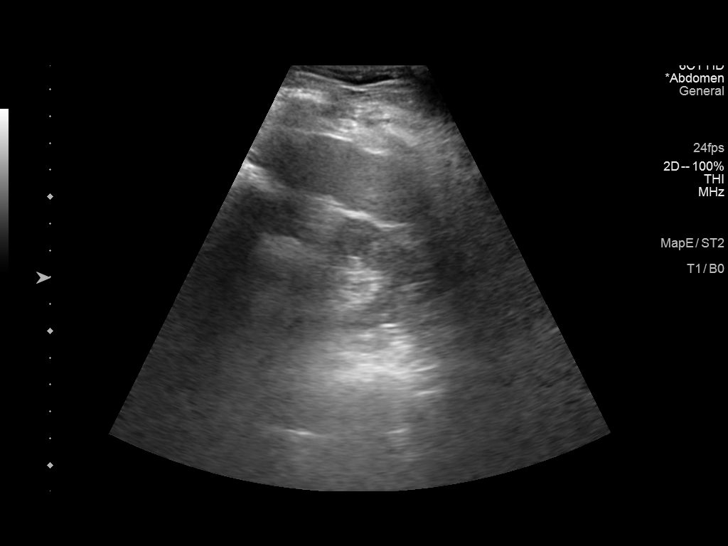
[im 83/83]
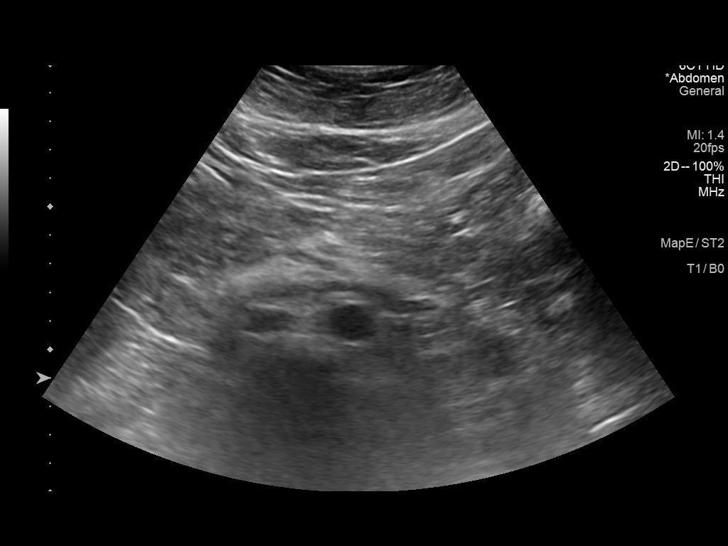

[13 of 25 positions shown; findings below may reference images not displayed]

FINDINGS: Gallbladder: Within the gallbladder, there are echogenic foci
consistent with cholelithiasis. Largest gallstone measures 1.5 cm in
length. Suspected ring down artifact along the anterior wall of the
gallbladder, likely representing inflammatory focus such as
adenomyomatosis or cholesterolosis. No gallbladder wall thickening
or pericholecystic fluid. No sonographic Murphy sign noted by
sonographer.

Common bile duct: Diameter: 3 mm. No intrahepatic, common hepatic,
or common bile duct dilatation.

Liver: No focal lesion identified. Liver echogenicity overall
increased. Portal vein is patent on color Doppler imaging with
normal direction of blood flow towards the liver.

IVC: No abnormality visualized.

Pancreas: No pancreatic mass or inflammatory focus.

Spleen: Size and appearance within normal limits.

Right Kidney: Length: 12.9 cm. Echogenicity within normal limits. No
mass or hydronephrosis visualized.

Left Kidney: Length: 13.0 cm. Echogenicity within normal limits. No
mass or hydronephrosis visualized.

Abdominal aorta: No aneurysm visualized.

Other findings: No evident ascites.
IMPRESSION: 1. Cholelithiasis. Suspect adenomyomatosis or cholesterolosis
arising in the gallbladder wall. No gallbladder wall thickening or
pericholecystic fluid.

2. Increase in liver echogenicity, a finding indicative of a degree
of hepatic steatosis. No focal liver lesions are evident. Note that
the sensitivity of ultrasound for detection of focal lesions is
somewhat diminished in this circumstance.

3.  Study otherwise unremarkable.

## 2023-04-21 ENCOUNTER — Encounter: Payer: Self-pay | Admitting: Internal Medicine

## 2023-04-21 ENCOUNTER — Ambulatory Visit (INDEPENDENT_AMBULATORY_CARE_PROVIDER_SITE_OTHER): Payer: Medicare HMO | Admitting: Internal Medicine

## 2023-04-21 VITALS — BP 139/79 | HR 124 | Ht 64.0 in | Wt 184.0 lb

## 2023-04-21 DIAGNOSIS — E039 Hypothyroidism, unspecified: Secondary | ICD-10-CM | POA: Diagnosis not present

## 2023-04-21 DIAGNOSIS — E66811 Obesity, class 1: Secondary | ICD-10-CM | POA: Diagnosis not present

## 2023-04-21 DIAGNOSIS — Z780 Asymptomatic menopausal state: Secondary | ICD-10-CM | POA: Diagnosis not present

## 2023-04-21 NOTE — Patient Instructions (Signed)
It was a pleasure to see you today.  Thank you for giving [place] the opportunity to be involved in your care.  Below is a brief recap of your visit and next steps.  We will plan to see you again in 1 year.  Summary You have established care today No medication changes were made We will update basic labs Follow up in 1 year

## 2023-04-21 NOTE — Progress Notes (Signed)
New Patient Office Visit  Subjective    Patient ID: Diane Graham, female    DOB: 07/28/1950  Age: 73 y.o. MRN: [id number]  CC:  Chief Complaint  Patient presents with   Establish Care    HPI Diane Graham presents to establish care.  She is a 73 year old woman who endorses a past medical history significant for hypothyroidism, acoustic neuroma s/p radiation resulting in deafness in her right ear, history of melanoma s/p excision, and history of menopause.  Previously followed by Dr. Neita Carp at [facility].  Diane Graham reports feeling well today.  She is asymptomatic and has no acute concerns to discuss aside from desiring to establish care.  She is currently retired and denies tobacco, alcohol, and illicit drug use.  Family medical history is significant for lung cancer, COPD, and appendiceal cancer.  Chronic medical conditions and outstanding preventative care items discussed today are individually addressed in A/P below.   Outpatient Encounter Medications as of 04/21/2023  Medication Sig   levothyroxine (SYNTHROID) 88 MCG tablet Take 88 mcg by mouth daily.   venlafaxine XR (EFFEXOR-XR) 37.5 MG 24 hr capsule Take 37.5 mg by mouth daily.   No facility-administered encounter medications on file as of 04/21/2023.    Past Medical History:  Diagnosis Date   Acoustic neuroma [facility])    Deaf, right    Thyroid disease     Past Surgical History:  Procedure Laterality Date   CHOLECYSTECTOMY  2022    History reviewed. No pertinent family history.  Social History   Socioeconomic History   Marital status: Married    Spouse name: Not on file   Number of children: Not on file   Years of education: Not on file   Highest education level: Not on file  Occupational History   Not on file  Tobacco Use   Smoking status: Never   Smokeless tobacco: Never  Substance and Sexual Activity   Alcohol use: Not Currently   Drug use: Not Currently   Sexual activity: Yes  Other Topics Concern    Not on file  Social History Narrative   Not on file   Social Drivers of Health   Financial Resource Strain: Not on file  Food Insecurity: Not on file  Transportation Needs: Not on file  Physical Activity: Not on file  Stress: Not on file  Social Connections: Not on file  Intimate Partner Violence: Not on file   Review of Systems  Constitutional:  Negative for chills and fever.  HENT:  Negative for sore throat.   Respiratory:  Negative for cough and shortness of breath.   Cardiovascular:  Negative for chest pain, palpitations and leg swelling.  Gastrointestinal:  Negative for abdominal pain, blood in stool, constipation, diarrhea, nausea and vomiting.  Genitourinary:  Negative for dysuria and hematuria.  Musculoskeletal:  Negative for myalgias.  Skin:  Negative for itching and rash.  Neurological:  Negative for dizziness and headaches.  Psychiatric/Behavioral:  Negative for depression and suicidal ideas.    Objective    BP 139/79 (BP Location: Right Arm, Patient Position: Sitting, Cuff Size: Normal)   Pulse (!) 124   Ht 5\' 4"  (1.626 m)   Wt 184 lb (83.5 kg)   SpO2 96%   BMI 31.58 kg/m   Physical Exam Vitals reviewed.  Constitutional:      General: She is not in acute distress.    Appearance: Normal appearance. She is obese. She is not toxic-appearing.  HENT:  Head: Normocephalic and atraumatic.     Right Ear: External ear normal.     Left Ear: External ear normal.     Ears:     Comments: Deaf in R ear    Nose: Nose normal. No congestion or rhinorrhea.     Mouth/Throat:     Mouth: Mucous membranes are moist.     Pharynx: Oropharynx is clear. No oropharyngeal exudate or posterior oropharyngeal erythema.  Eyes:     General: No scleral icterus.    Extraocular Movements: Extraocular movements intact.     Conjunctiva/sclera: Conjunctivae normal.     Pupils: Pupils are equal, round, and reactive to light.  Cardiovascular:     Rate and Rhythm: Normal rate and  regular rhythm.     Pulses: Normal pulses.     Heart sounds: Normal heart sounds. No murmur heard.    No friction rub. No gallop.  Pulmonary:     Effort: Pulmonary effort is normal.     Breath sounds: Normal breath sounds. No wheezing, rhonchi or rales.  Abdominal:     General: Abdomen is flat. Bowel sounds are normal. There is no distension.     Palpations: Abdomen is soft.     Tenderness: There is no abdominal tenderness.  Musculoskeletal:        General: No swelling. Normal range of motion.     Cervical back: Normal range of motion.     Right lower leg: No edema.     Left lower leg: No edema.  Lymphadenopathy:     Cervical: No cervical adenopathy.  Skin:    General: Skin is warm and dry.     Capillary Refill: Capillary refill takes less than 2 seconds.     Coloration: Skin is not jaundiced.  Neurological:     General: No focal deficit present.     Mental Status: She is alert and oriented to person, place, and time.  Psychiatric:        Mood and Affect: Mood normal.        Behavior: Behavior normal.    Assessment & Plan:   Problem List Items Addressed This Visit       Hypothyroidism - Primary   She endorses a history of hypothyroidism and is prescribed levothyroxine 88 mcg daily.  She is asymptomatic currently.  Repeat thyroid studies ordered today.      History of menopause   Currently prescribed Effexor 37.5 mg daily.  She reports that Effexor was started years ago during menopause and she has never stopped taking it because she likes how her mood is while on medication.  PHQ-9 score is mildly elevated today (10).  Denies SI/HI.  She feels that her mood is stable currently. No medication changes were made today.      Return in about 1 year (around 04/20/2024).   Billie Lade, MD

## 2023-04-21 NOTE — Assessment & Plan Note (Signed)
She endorses a history of hypothyroidism and is prescribed levothyroxine 88 mcg daily.  She is asymptomatic currently.  Repeat thyroid studies ordered today.

## 2023-04-21 NOTE — Assessment & Plan Note (Signed)
Currently prescribed Effexor 37.5 mg daily.  She reports that Effexor was started years ago during menopause and she has never stopped taking it because she likes how her mood is while on medication.  PHQ-9 score is mildly elevated today (10).  Denies SI/HI.  She feels that her mood is stable currently. No medication changes were made today.

## 2023-04-22 ENCOUNTER — Encounter: Payer: Self-pay | Admitting: Internal Medicine

## 2023-04-22 LAB — CMP14+EGFR
ALT: 27 [IU]/L (ref 0–32)
AST: 24 [IU]/L (ref 0–40)
Albumin: 4.2 g/dL (ref 3.8–4.8)
Alkaline Phosphatase: 83 [IU]/L (ref 44–121)
BUN/Creatinine Ratio: 12 (ref 12–28)
BUN: 11 mg/dL (ref 8–27)
Bilirubin Total: 0.3 mg/dL (ref 0.0–1.2)
CO2: 24 mmol/L (ref 20–29)
Calcium: 9.4 mg/dL (ref 8.7–10.3)
Chloride: 100 mmol/L (ref 96–106)
Creatinine, Ser: 0.91 mg/dL (ref 0.57–1.00)
Globulin, Total: 2.6 g/dL (ref 1.5–4.5)
Glucose: 90 mg/dL (ref 70–99)
Potassium: 4.2 mmol/L (ref 3.5–5.2)
Sodium: 138 mmol/L (ref 134–144)
Total Protein: 6.8 g/dL (ref 6.0–8.5)
eGFR: 66 mL/min/{1.73_m2} (ref >59)

## 2023-04-22 LAB — LIPID PANEL
Chol/HDL Ratio: 3.5 {ratio} (ref 0.0–4.4)
Cholesterol, Total: 165 mg/dL (ref 100–199)
HDL: 47 mg/dL (ref >39)
LDL Chol Calc (NIH): 83 mg/dL (ref 0–99)
Triglycerides: 209 mg/dL — ABNORMAL HIGH (ref 0–149)
VLDL Cholesterol Cal: 35 mg/dL (ref 5–40)

## 2023-04-22 LAB — TSH+FREE T4
Free T4: 1.05 ng/dL (ref 0.82–1.77)
TSH: 5.67 u[IU]/mL — ABNORMAL HIGH (ref 0.450–4.500)

## 2023-04-22 LAB — CBC WITH DIFFERENTIAL/PLATELET
Basophils Absolute: 0.1 10*3/uL (ref 0.0–0.2)
Basos: 1 %
EOS (ABSOLUTE): 0.2 10*3/uL (ref 0.0–0.4)
Eos: 3 %
Hematocrit: 42.5 % (ref 34.0–46.6)
Hemoglobin: 14.1 g/dL (ref 11.1–15.9)
Immature Grans (Abs): 0 10*3/uL (ref 0.0–0.1)
Immature Granulocytes: 0 %
Lymphocytes Absolute: 1.6 10*3/uL (ref 0.7–3.1)
Lymphs: 22 %
MCH: 30.5 pg (ref 26.6–33.0)
MCHC: 33.2 g/dL (ref 31.5–35.7)
MCV: 92 fL (ref 79–97)
Monocytes Absolute: 1.1 10*3/uL — ABNORMAL HIGH (ref 0.1–0.9)
Monocytes: 15 %
Neutrophils Absolute: 4.5 10*3/uL (ref 1.4–7.0)
Neutrophils: 59 %
Platelets: 296 10*3/uL (ref 150–450)
RBC: 4.63 x10E6/uL (ref 3.77–5.28)
RDW: 12.5 % (ref 11.7–15.4)
WBC: 7.5 10*3/uL (ref 3.4–10.8)

## 2023-04-22 LAB — B12 AND FOLATE PANEL
Folate: >20 ng/mL (ref >3.0)
Vitamin B-12: >2000 pg/mL — ABNORMAL HIGH (ref 232–1245)

## 2023-04-22 LAB — VITAMIN D 25 HYDROXY (VIT D DEFICIENCY, FRACTURES): Vit D, 25-Hydroxy: 51.4 ng/mL (ref 30.0–100.0)

## 2023-04-22 LAB — HEMOGLOBIN A1C
Est. average glucose Bld gHb Est-mCnc: 111 mg/dL
Hgb A1c MFr Bld: 5.5 % (ref 4.8–5.6)

## 2023-06-02 ENCOUNTER — Ambulatory Visit (INDEPENDENT_AMBULATORY_CARE_PROVIDER_SITE_OTHER)

## 2023-06-02 VITALS — Ht 64.0 in | Wt 182.0 lb

## 2023-06-02 DIAGNOSIS — Z0000 Encounter for general adult medical examination without abnormal findings: Secondary | ICD-10-CM

## 2023-06-02 DIAGNOSIS — Z1159 Encounter for screening for other viral diseases: Secondary | ICD-10-CM

## 2023-06-02 DIAGNOSIS — Z780 Asymptomatic menopausal state: Secondary | ICD-10-CM

## 2023-06-02 DIAGNOSIS — Z1231 Encounter for screening mammogram for malignant neoplasm of breast: Secondary | ICD-10-CM

## 2023-06-02 NOTE — Patient Instructions (Signed)
 Diane Graham , Thank you for taking time to come for your Medicare Wellness Visit. I appreciate your ongoing commitment to your health goals. Please review the following plan we discussed and let me know if I can assist you in the future.   Referrals/Orders/Follow-Ups/Clinician Recommendations:  Next Medicare Annual Wellness Visit:   June 06, 2024 at 8:40 am  Please call the number listed below to schedule your appointment for:  [facility] [address], [place] [address] [phone] [x]   3D Mammogram  [x]   Bone Density      This is a list of the screening recommended for you and due dates:  Health Maintenance  Topic Date Due   DEXA scan (bone density measurement)  Never done   Mammogram  Never done   Hepatitis C Screening  Never done   Colon Cancer Screening  Never done   COVID-19 Vaccine (1 - 2024-25 season) Never done   DTaP/Tdap/Td vaccine (2 - Td or Tdap) 03/28/2024   Medicare Annual Wellness Visit  06/01/2024   Pneumonia Vaccine  Completed   Flu Shot  Completed   Zoster (Shingles) Vaccine  Completed   HPV Vaccine  Aged Out    Advanced directives: (Declined) Advance directive discussed with you today. Even though you declined this today, please call our office should you change your mind, and we can give you the proper paperwork for you to fill out.  Next Medicare Annual Wellness Visit scheduled for next year: yes  Understanding Your Risk for Falls Millions of people have serious injuries from falls each year. It is important to understand your risk of falling. Talk with your health care provider about your risk and what you can do to lower it. If you do have a serious fall, make sure to tell your provider. Falling once raises your risk of falling again. How can falls affect me? Serious injuries from falls are common. These include: Broken bones, such as hip fractures. Head injuries, such as traumatic brain injuries (TBI) or concussions. A  fear of falling can cause you to avoid activities and stay at home. This can make your muscles weaker and raise your risk for a fall. What can increase my risk? There are a number of risk factors that increase your risk for falling. The more risk factors you have, the higher your risk of falling. Serious injuries from a fall happen most often to people who are older than 75 years old. Teenagers and young adults ages 21-29 are also at higher risk. Common risk factors include: Weakness in the lower body. Being generally weak or confused due to long-term (chronic) illness. Dizziness or balance problems. Poor vision. Medicines that cause dizziness or drowsiness. These may include: Medicines for your blood pressure, heart, anxiety, insomnia, or swelling (edema). Pain medicines. Muscle relaxants. Other risk factors include: Drinking alcohol. Having had a fall in the past. Having foot pain or wearing improper footwear. Working at a dangerous job. Having any of the following in your home: Tripping hazards, such as floor clutter or loose rugs. Poor lighting. Pets. Having dementia or memory loss. What actions can I take to lower my risk of falling?     Physical activity Stay physically fit. Do strength and balance exercises. Consider taking a regular class to build strength and balance. Yoga and tai chi are good options. Vision Have your eyes checked every year and your prescription for glasses or contacts updated as needed. Shoes and walking aids Wear non-skid  shoes. Wear shoes that have rubber soles and low heels. Do not wear high heels. Do not walk around the house in socks or slippers. Use a cane or walker as told by your provider. Home safety Attach secure railings on both sides of your stairs. Install grab bars for your bathtub, shower, and toilet. Use a non-skid mat in your bathtub or shower. Attach bath mats securely with double-sided, non-slip rug tape. Use good lighting in all  rooms. Keep a flashlight near your bed. Make sure there is a clear path from your bed to the bathroom. Use night-lights. Do not use throw rugs. Make sure all carpeting is taped or tacked down securely. Remove all clutter from walkways and stairways, including extension cords. Repair uneven or broken steps and floors. Avoid walking on icy or slippery surfaces. Walk on the grass instead of on icy or slick sidewalks. Use ice melter to get rid of ice on walkways in the winter. Use a cordless phone. Questions to ask your health care provider Can you help me check my risk for a fall? Do any of my medicines make me more likely to fall? Should I take a vitamin D supplement? What exercises can I do to improve my strength and balance? Should I make an appointment to have my vision checked? Do I need a bone density test to check for weak bones (osteoporosis)? Would it help to use a cane or a walker? Where to find more information Centers for Disease Control and Prevention, STEADI: TonerPromos.no Community-Based Fall Prevention Programs: TonerPromos.no [employer] on Aging: BaseRingTones.pl Contact a health care provider if: You fall at home. You are afraid of falling at home. You feel weak, drowsy, or dizzy. This information is not intended to replace advice given to you by your health care provider. Make sure you discuss any questions you have with your health care provider. Document Revised: 11/17/2021 Document Reviewed: 11/17/2021 Elsevier Patient Education  2024 [employer].

## 2023-06-02 NOTE — Progress Notes (Signed)
 Because this visit was a virtual/telehealth visit,  certain criteria was not obtained, such a blood pressure, CBG if applicable, and timed get up and go. Any medications not marked as "taking" were not mentioned during the medication reconciliation part of the visit. Any vitals not documented were not able to be obtained due to this being a telehealth visit or patient was unable to self-report a recent blood pressure reading due to a lack of equipment at home via telehealth. Vitals that have been documented are verbally provided by the patient.   Subjective:   Diane Graham is a 73 y.o. who presents for a Medicare Wellness preventive visit.  Visit Complete: Virtual I connected with  Diane Graham on 06/02/23 by a audio enabled telemedicine application and verified that I am speaking with the correct person using two identifiers.  Patient Location: Home  Provider Location: Home Office  I discussed the limitations of evaluation and management by telemedicine. The patient expressed understanding and agreed to proceed.  Vital Signs: Because this visit was a virtual/telehealth visit, some criteria may be missing or patient reported. Any vitals not documented were not able to be obtained and vitals that have been documented are patient reported.  VideoDeclined- This patient declined Librarian, academic. Therefore the visit was completed with audio only.  AWV Questionnaire: No: Patient Medicare AWV questionnaire was not completed prior to this visit.  Cardiac Risk Factors include: advanced age (>73men, >21 women);obesity (BMI >30kg/m2);sedentary lifestyle     Objective:    Today's Vitals   06/02/23 1518  Weight: 182 lb (82.6 kg)  Height: 5\' 4"  (1.626 m)   Body mass index is 31.24 kg/m.     06/02/2023    3:23 PM 03/10/2018   11:46 AM  Advanced Directives  Does Patient Have a Medical Advance Directive? No Yes  Type of Special educational needs teacher of  [place];Living will  Does patient want to make changes to medical advance directive?  No - Patient declined  Would patient like information on creating a medical advance directive? No - Patient declined     Current Medications (verified) Outpatient Encounter Medications as of 06/02/2023  Medication Sig   levothyroxine (SYNTHROID) 88 MCG tablet Take 88 mcg by mouth daily.   venlafaxine XR (EFFEXOR-XR) 37.5 MG 24 hr capsule Take 37.5 mg by mouth daily.   No facility-administered encounter medications on file as of 06/02/2023.    Allergies (verified) Patient has no known allergies.   History: Past Medical History:  Diagnosis Date   Acoustic neuroma (HCC)    Deaf, right    Thyroid disease    Past Surgical History:  Procedure Laterality Date   CHOLECYSTECTOMY  2022   History reviewed. No pertinent family history. Social History   Socioeconomic History   Marital status: Married    Spouse name: Not on file   Number of children: Not on file   Years of education: Not on file   Highest education level: Not on file  Occupational History   Not on file  Tobacco Use   Smoking status: Never   Smokeless tobacco: Never  Substance and Sexual Activity   Alcohol use: Not Currently   Drug use: Not Currently   Sexual activity: Yes  Other Topics Concern   Not on file  Social History Narrative   Not on file   Social Drivers of Health   Financial Resource Strain: Low Risk  (06/02/2023)   Overall Financial Resource Strain (CARDIA)  Difficulty of Paying Living Expenses: Not hard at all  Food Insecurity: No Food Insecurity (06/02/2023)   Hunger Vital Sign    Worried About Running Out of Food in the Last Year: Never true    Ran Out of Food in the Last Year: Never true  Transportation Needs: No Transportation Needs (06/02/2023)   PRAPARE - Administrator, Civil Service (Medical): No    Lack of Transportation (Non-Medical): No  Physical Activity: Inactive (06/02/2023)   Exercise  Vital Sign    Days of Exercise per Week: 0 days    Minutes of Exercise per Session: 0 min  Stress: No Stress Concern Present (06/02/2023)   [employer] of Occupational Health - Occupational Stress Questionnaire    Feeling of Stress : Not at all  Social Connections: Socially Integrated (06/02/2023)   Social Connection and Isolation Panel [NHANES]    Frequency of Communication with Friends and Family: More than three times a week    Frequency of Social Gatherings with Friends and Family: More than three times a week    Attends Religious Services: More than 4 times per year    Active Member of [employer] or Organizations: Yes    Attends Engineer, structural: More than 4 times per year    Marital Status: Married    Tobacco Counseling Counseling given: Yes    Clinical Intake:  Pre-visit preparation completed: Yes  Pain : No/denies pain     BMI - recorded: 31.24 Nutritional Status: BMI > 30  Obese Nutritional Risks: None Diabetes: No  How often do you need to have someone help you when you read instructions, pamphlets, or other written materials from your doctor or pharmacy?: 1 - Never  Interpreter Needed?: No  Information entered by :: Maryjean Ka CMA   Activities of Daily Living     06/02/2023    3:23 PM  In your present state of health, do you have any difficulty performing the following activities:  Hearing? 0  Vision? 0  Difficulty concentrating or making decisions? 0  Walking or climbing stairs? 0  Dressing or bathing? 0  Doing errands, shopping? 0  Preparing Food and eating ? N  Using the Toilet? N  In the past six months, have you accidently leaked urine? N  Do you have problems with loss of bowel control? N  Managing your Medications? N  Managing your Finances? N  Housekeeping or managing your Housekeeping? N    Patient Care Team: Diane Lade, MD as PCP - General (Internal Medicine)  Indicate any recent Medical Services you may have received from  other than [facility] providers in the past year (date may be approximate).     Assessment:   This is a routine wellness examination for Diane Graham.  Hearing/Vision screen Hearing Screening - Comments:: Patient has hearing aids Sees [facility] Vision Screening - Comments:: Wears rx glasses - up to date with routine eye exams  Patient [facility]   Goals Addressed             This Visit's Progress    Patient Stated       Lose weight        Depression Screen     06/02/2023    3:24 PM 04/21/2023    3:32 PM  PHQ 2/9 Scores  PHQ - 2 Score 0 1  PHQ- 9 Score 0 10    Fall Risk     06/02/2023    3:22 PM  04/21/2023    3:32 PM 02/22/2019   10:08 AM  Fall Risk   Falls in the past year? 0 0 0  Comment   Emmi Telephone Survey: data to providers prior to load  Number falls in past yr: 0 0   Injury with Fall? 0 0   Risk for fall due to : No Fall Risks No Fall Risks   Follow up Falls prevention discussed;Falls evaluation completed Falls evaluation completed     MEDICARE RISK AT HOME:  Medicare Risk at Home Any stairs in or around the home?: No If so, are there any without handrails?: No Home free of loose throw rugs in walkways, pet beds, electrical cords, etc?: Yes Adequate lighting in your home to reduce risk of falls?: Yes Life alert?: No Use of a cane, walker or w/c?: No Grab bars in the bathroom?: Yes Shower chair or bench in shower?: No Elevated toilet seat or a handicapped toilet?: No  TIMED UP AND GO:  Was the test performed?  No  Cognitive Function: 6CIT completed        06/02/2023    3:19 PM  6CIT Screen  What Year? 0 points  What month? 0 points  What time? 0 points  Count back from 20 0 points  Months in reverse 0 points  Repeat phrase 0 points  Total Score 0 points    Immunizations Immunization History  Administered Date(s) Administered   Fluad Quad(high Dose 65+) 01/17/2023   Influenza-Unspecified 01/28/2018   Pneumococcal  Conjugate-13 03/28/2014   Pneumococcal Polysaccharide-23 09/17/2016   Tdap 03/28/2014   Zoster Recombinant(Shingrix) 04/28/2021, 06/23/2021    Screening Tests Health Maintenance  Topic Date Due   DEXA SCAN  Never done   MAMMOGRAM  Never done   Hepatitis C Screening  Never done   Colonoscopy  Never done   COVID-19 Vaccine (1 - 2024-25 season) Never done   DTaP/Tdap/Td (2 - Td or Tdap) 03/28/2024   Medicare Annual Wellness (AWV)  06/01/2024   Pneumonia Vaccine 55+ Years old  Completed   INFLUENZA VACCINE  Completed   Zoster Vaccines- Shingrix  Completed   HPV VACCINES  Aged Out    Health Maintenance  Health Maintenance Due  Topic Date Due   DEXA SCAN  Never done   MAMMOGRAM  Never done   Hepatitis C Screening  Never done   Colonoscopy  Never done   COVID-19 Vaccine (1 - 2024-25 season) Never done   Health Maintenance Items Addressed: Mammogram ordered, DEXA ordered, Hepatitis C Screening  Additional Screening:  Vision Screening: Recommended annual ophthalmology exams for early detection of glaucoma and other disorders of the eye.  Dental Screening: Recommended annual dental exams for proper oral hygiene  Community Resource Referral / Chronic Care Management: CRR required this visit?  No   CCM required this visit?  No     Plan:     I have personally reviewed and noted the following in the patient's chart:   Medical and social history Use of alcohol, tobacco or illicit drugs  Current medications and supplements including opioid prescriptions. Patient is not currently taking opioid prescriptions. Functional ability and status Nutritional status Physical activity Advanced directives List of other physicians Hospitalizations, surgeries, and ER visits in previous 12 months Vitals Screenings to include cognitive, depression, and falls Referrals and appointments  In addition, I have reviewed and discussed with patient certain preventive protocols, quality  metrics, and best practice recommendations. A written personalized care plan for preventive services as  well as general preventive health recommendations were provided to patient.     Diane Graham, CMA   06/02/2023   After Visit Summary: (MyChart) Due to this being a telephonic visit, the after visit summary with patients personalized plan was offered to patient via MyChart   Notes: Please refer to Routing Comments.

## 2023-06-08 ENCOUNTER — Telehealth: Payer: Self-pay

## 2023-06-08 NOTE — Telephone Encounter (Signed)
 Copied from CRM [id number]. Topic: General - Other >> Jun 08, 2023  2:39 PM Geneva B wrote: Reason for CRM: [facility] please call patient back to get scheduled [phone]

## 2023-06-09 NOTE — Telephone Encounter (Signed)
 Orders placed.

## 2023-06-28 ENCOUNTER — Ambulatory Visit: Payer: Self-pay

## 2023-06-28 NOTE — Telephone Encounter (Signed)
 Chief Complaint: Leg Pain Symptoms: bilateral leg pain 7/10 Frequency: Comes and goes  Pertinent Negatives: Patient denies swelling, nausea, vomiting Disposition: [] ED /[] Urgent Care (no appt availability in office) / [x] Appointment(In office/virtual)/ []  [facility]/ [] Home Care/ [] Refused Recommended Disposition /[] [facility]/ []  Follow-up with PCP Additional Notes: Patient states she has had bilateral leg pain at rest for about 6-8 months that she has tried treat with heating pads and tylenol. Patient states she has not had much improvement and thinks it is time to have a provider check it out for her. Patient reports the pain only occurs when sitting down or trying to fall asleep. Care advice was given and patient was offered an appointment today but is unable to make it due to the distance from her home. Patient has been scheduled for an appointment tomorrow morning at [facility].   Copied from CRM [id number]. Topic: Appointments - Appointment Scheduling >> Jun 28, 2023  1:37 PM Emylou G wrote: Legs hurt when she sits down and when she tries to sleep it..pls call her [phone] Reason for Disposition  [1] MODERATE pain (e.g., interferes with normal activities, limping) AND [2] present > 3 days  Answer Assessment - Initial Assessment Questions 1. ONSET: "When did the pain start?"      Ongoing for 6-8 months  2. LOCATION: "Where is the pain located?"      Bilateral  3. PAIN: "How bad is the pain?"    (Scale 1-10; or mild, moderate, severe)   -  MILD (1-3): doesn't interfere with normal activities    -  MODERATE (4-7): interferes with normal activities (e.g., work or school) or awakens from sleep, limping    -  SEVERE (8-10): excruciating pain, unable to do any normal activities, unable to walk     8/10 4. WORK OR EXERCISE: "Has there been any recent work or exercise that involved this part of the body?"      No 5. CAUSE: "What do you think is causing the leg pain?"      I'm not sure  6. OTHER SYMPTOMS: "Do you have any other symptoms?" (e.g., chest pain, back pain, breathing difficulty, swelling, rash, fever, numbness, weakness)     No  Protocols used: Leg Pain-A-AH

## 2023-06-29 ENCOUNTER — Encounter: Payer: Self-pay | Admitting: Family Medicine

## 2023-06-29 ENCOUNTER — Ambulatory Visit: Admitting: Family Medicine

## 2023-06-29 VITALS — BP 132/78 | HR 83 | Temp 97.2°F | Ht 64.0 in | Wt 189.0 lb

## 2023-06-29 DIAGNOSIS — G2581 Restless legs syndrome: Secondary | ICD-10-CM | POA: Diagnosis not present

## 2023-06-29 MED ORDER — GABAPENTIN 300 MG PO CAPS: 300.0000 mg | ORAL_CAPSULE | Freq: Every day | ORAL | 3 refills | Status: DC

## 2023-06-29 NOTE — Patient Instructions (Signed)
 Try the medication.  Follow up with your PCP.  Take care  Dr. Adriana Simas

## 2023-06-29 NOTE — Assessment & Plan Note (Signed)
 Patient's history consistent with restless leg.  Trial of gabapentin.

## 2023-06-29 NOTE — Progress Notes (Signed)
 Subjective:  Patient ID: Diane Graham, female    DOB: [birth year]  Age: 73 y.o. MRN: [id number]  CC:   Chief Complaint  Patient presents with   Leg Pain    Bilateral leg pain. Has tried Tylenol, otc restless leg medication, heating pad     HPI:  73 year old female presents for evaluation of the above.  Patient reports ongoing pain in her lower extremities below the knees particularly in the calf.  She states that it occurs predominantly at night when she is going to bed.  She states that the discomfort feels like a burning sensation.  She feels the need to stretch or move her legs.  Denies back pain.  No fall, trauma, injury.  She has tried Tylenol and some over-the-counter treatment as well as a heating pad without resolution.  Patient Active Problem List   Diagnosis Date Noted   RLS (restless legs syndrome) 06/29/2023   Hypothyroidism 04/21/2023   History of menopause 04/21/2023    Social Hx   Social History   Socioeconomic History   Marital status: Married    Spouse name: Not on file   Number of children: Not on file   Years of education: Not on file   Highest education level: Not on file  Occupational History   Not on file  Tobacco Use   Smoking status: Never   Smokeless tobacco: Never  Substance and Sexual Activity   Alcohol use: Not Currently   Drug use: Not Currently   Sexual activity: Yes  Other Topics Concern   Not on file  Social History Narrative   Not on file   Social Drivers of Health   Financial Resource Strain: Low Risk  (06/02/2023)   Overall Financial Resource Strain (CARDIA)    Difficulty of Paying Living Expenses: Not hard at all  Food Insecurity: No Food Insecurity (06/02/2023)   Hunger Vital Sign    Worried About Running Out of Food in the Last Year: Never true    Ran Out of Food in the Last Year: Never true  Transportation Needs: No Transportation Needs (06/02/2023)   PRAPARE - Administrator, Civil Service (Medical): No    Lack of  Transportation (Non-Medical): No  Physical Activity: Inactive (06/02/2023)   Exercise Vital Sign    Days of Exercise per Week: 0 days    Minutes of Exercise per Session: 0 min  Stress: No Stress Concern Present (06/02/2023)   [employer] of Occupational Health - Occupational Stress Questionnaire    Feeling of Stress : Not at all  Social Connections: Socially Integrated (06/02/2023)   Social Connection and Isolation Panel [NHANES]    Frequency of Communication with Friends and Family: More than three times a week    Frequency of Social Gatherings with Friends and Family: More than three times a week    Attends Religious Services: More than 4 times per year    Active Member of [employer] or Organizations: Yes    Attends Engineer, structural: More than 4 times per year    Marital Status: Married    Review of Systems Per HPI  Objective:  BP 132/78   Pulse 83   Temp (!) 97.2 F (36.2 C)   Ht 5\' 4"  (1.626 m)   Wt 189 lb (85.7 kg)   SpO2 98%   BMI 32.44 kg/m      06/29/2023    9:57 AM 06/02/2023    3:18 PM 04/21/2023    3:29 PM  BP/Weight  Systolic BP 132 -- 139  Diastolic BP 78 -- 79  Wt. (Lbs) 189 182 184  BMI 32.44 kg/m2 31.24 kg/m2 31.58 kg/m2    Physical Exam Vitals and nursing note reviewed.  Constitutional:      Appearance: Normal appearance.  HENT:     Head: Normocephalic and atraumatic.  Cardiovascular:     Rate and Rhythm: Normal rate and regular rhythm.  Pulmonary:     Effort: Pulmonary effort is normal.     Breath sounds: Normal breath sounds. No wheezing, rhonchi or rales.  Neurological:     Mental Status: She is alert.  Psychiatric:        Mood and Affect: Mood normal.        Behavior: Behavior normal.     Lab Results  Component Value Date   WBC 7.5 04/21/2023   HGB 14.1 04/21/2023   HCT 42.5 04/21/2023   PLT 296 04/21/2023   GLUCOSE 90 04/21/2023   CHOL 165 04/21/2023   TRIG 209 (H) 04/21/2023   HDL 47 04/21/2023   LDLCALC 83 04/21/2023    ALT 27 04/21/2023   AST 24 04/21/2023   NA 138 04/21/2023   K 4.2 04/21/2023   CL 100 04/21/2023   CREATININE 0.91 04/21/2023   BUN 11 04/21/2023   CO2 24 04/21/2023   TSH 5.670 (H) 04/21/2023   HGBA1C 5.5 04/21/2023     Assessment & Plan:  RLS (restless legs syndrome) Assessment & Plan: Patient's history consistent with restless leg.  Trial of gabapentin.  Orders: -     Gabapentin; Take 1 capsule (300 mg total) by mouth at bedtime.  Dispense: 30 capsule; Refill: 3   Japleen Tornow DO [facility]

## 2023-08-25 ENCOUNTER — Ambulatory Visit (INDEPENDENT_AMBULATORY_CARE_PROVIDER_SITE_OTHER): Admitting: Family Medicine

## 2023-08-25 ENCOUNTER — Encounter: Payer: Self-pay | Admitting: Family Medicine

## 2023-08-25 VITALS — BP 125/76 | HR 74 | Temp 97.7°F | Ht 64.0 in | Wt 187.0 lb

## 2023-08-25 DIAGNOSIS — R079 Chest pain, unspecified: Secondary | ICD-10-CM | POA: Diagnosis not present

## 2023-08-25 DIAGNOSIS — G4700 Insomnia, unspecified: Secondary | ICD-10-CM | POA: Diagnosis not present

## 2023-08-25 MED ORDER — ZOLPIDEM TARTRATE 5 MG PO TABS: 5.0000 mg | ORAL_TABLET | Freq: Every evening | ORAL | 1 refills | Status: DC | PRN

## 2023-08-25 NOTE — Patient Instructions (Signed)
 Medication as prescribed.  Referral placed to Cardiology.  Take care  Dr. Debrah Fan

## 2023-08-25 NOTE — Progress Notes (Signed)
 Subjective:  Patient ID: Diane Graham, female    DOB: [birth year]  Age: 73 y.o. MRN: [id number]  CC:   Chief Complaint  Patient presents with   Chest Pain    2 to 3 weeks on and off arm and jaw tightness, fm hx of stroke and heart disease   Insomnia    HPI: 73 year old female presents for evaluation of the above.  Patient reports that over the past 2 weeks she has had 2-3 episodes of chest pain.  She states that they are not associated with exertion.  Pain is located centrally.  She reports that she has had some ongoing shortness of breath with exertion, particular going up steps.  She describes the pain as a pressure sensation.  She has taken aspirin for this.  She states that the pain resolves within 10 to 15 minutes spontaneously.  No reports of diaphoresis.  No other associated symptoms.  Patient also reports chronic insomnia.  She states that this has been going on for the past few years.  She states that she uses melatonin and Advil PM.  She has previously been prescribed trazodone which she did not tolerate.  Patient states that she has difficulty falling asleep.  She would like to discuss treatment options today.   Patient Active Problem List   Diagnosis Date Noted   Chest pain 08/25/2023   Insomnia 08/25/2023   RLS (restless legs syndrome) 06/29/2023   Hypothyroidism 04/21/2023   History of menopause 04/21/2023    Social Hx   Social History   Socioeconomic History   Marital status: Married    Spouse name: Not on file   Number of children: Not on file   Years of education: Not on file   Highest education level: Not on file  Occupational History   Not on file  Tobacco Use   Smoking status: Never   Smokeless tobacco: Never  Substance and Sexual Activity   Alcohol use: Not Currently   Drug use: Not Currently   Sexual activity: Yes  Other Topics Concern   Not on file  Social History Narrative   Not on file   Social Drivers of Health   Financial Resource Strain:  Low Risk  (06/02/2023)   Overall Financial Resource Strain (CARDIA)    Difficulty of Paying Living Expenses: Not hard at all  Food Insecurity: No Food Insecurity (06/02/2023)   Hunger Vital Sign    Worried About Running Out of Food in the Last Year: Never true    Ran Out of Food in the Last Year: Never true  Transportation Needs: No Transportation Needs (06/02/2023)   PRAPARE - Administrator, Civil Service (Medical): No    Lack of Transportation (Non-Medical): No  Physical Activity: Inactive (06/02/2023)   Exercise Vital Sign    Days of Exercise per Week: 0 days    Minutes of Exercise per Session: 0 min  Stress: No Stress Concern Present (06/02/2023)   [employer] of Occupational Health - Occupational Stress Questionnaire    Feeling of Stress : Not at all  Social Connections: Socially Integrated (06/02/2023)   Social Connection and Isolation Panel [NHANES]    Frequency of Communication with Friends and Family: More than three times a week    Frequency of Social Gatherings with Friends and Family: More than three times a week    Attends Religious Services: More than 4 times per year    Active Member of Clubs or Organizations: Yes    Attends  Engineer, structural: More than 4 times per year    Marital Status: Married    Review of Systems Per HPI  Objective:  BP 125/76   Pulse 74   Temp 97.7 F (36.5 C)   Ht 5\' 4"  (1.626 m)   Wt 187 lb (84.8 kg)   SpO2 96%   BMI 32.10 kg/m      08/25/2023   10:57 AM 06/29/2023    9:57 AM 06/02/2023    3:18 PM  BP/Weight  Systolic BP 125 132 --  Diastolic BP 76 78 --  Wt. (Lbs) 187 189 182  BMI 32.1 kg/m2 32.44 kg/m2 31.24 kg/m2    Physical Exam Vitals and nursing note reviewed.  Constitutional:      Appearance: Normal appearance.  HENT:     Head: Normocephalic and atraumatic.  Eyes:     General:        Right eye: No discharge.        Left eye: No discharge.     Conjunctiva/sclera: Conjunctivae normal.   Cardiovascular:     Rate and Rhythm: Normal rate and regular rhythm.  Pulmonary:     Effort: Pulmonary effort is normal.     Breath sounds: Normal breath sounds.  Abdominal:     General: There is no distension.     Palpations: Abdomen is soft.     Tenderness: There is no abdominal tenderness.  Neurological:     Mental Status: She is alert.  Psychiatric:     Comments: Flat affect.     Lab Results  Component Value Date   WBC 7.5 04/21/2023   HGB 14.1 04/21/2023   HCT 42.5 04/21/2023   PLT 296 04/21/2023   GLUCOSE 90 04/21/2023   CHOL 165 04/21/2023   TRIG 209 (H) 04/21/2023   HDL 47 04/21/2023   LDLCALC 83 04/21/2023   ALT 27 04/21/2023   AST 24 04/21/2023   NA 138 04/21/2023   K 4.2 04/21/2023   CL 100 04/21/2023   CREATININE 0.91 04/21/2023   BUN 11 04/21/2023   CO2 24 04/21/2023   TSH 5.670 (H) 04/21/2023   HGBA1C 5.5 04/21/2023   EKG: Independent interpretation -normal sinus rhythm with a rate of 70.  Normal axis.  Normal intervals.  No ST or T wave changes.  Normal EKG.  Assessment & Plan:  Chest pain, unspecified type Assessment & Plan: EKG normal today.  Referred to cardiology for consideration for stress test or coronary CT.  Orders: -     EKG 12-Lead -     Ambulatory referral to Cardiology  Insomnia, unspecified type Assessment & Plan: Chronic, uncontrolled.  Recent worsening.  Trial of Ambien.   Other orders -     Zolpidem Tartrate; Take 1 tablet (5 mg total) by mouth at bedtime as needed for sleep.  Dispense: 30 tablet; Refill: 1    Khylon Davies DO [facility]

## 2023-08-25 NOTE — Assessment & Plan Note (Signed)
 EKG normal today.  Referred to cardiology for consideration for stress test or coronary CT.

## 2023-08-25 NOTE — Assessment & Plan Note (Signed)
 Chronic, uncontrolled.  Recent worsening.  Trial of Ambien.

## 2023-10-23 ENCOUNTER — Other Ambulatory Visit: Payer: Self-pay | Admitting: Family Medicine

## 2023-10-23 DIAGNOSIS — G2581 Restless legs syndrome: Secondary | ICD-10-CM

## 2023-10-25 ENCOUNTER — Other Ambulatory Visit: Payer: Self-pay

## 2023-11-09 ENCOUNTER — Encounter: Payer: Self-pay | Admitting: *Deleted

## 2023-11-09 ENCOUNTER — Ambulatory Visit: Attending: Internal Medicine | Admitting: Internal Medicine

## 2023-11-09 ENCOUNTER — Telehealth: Payer: Self-pay | Admitting: Internal Medicine

## 2023-11-09 ENCOUNTER — Encounter: Payer: Self-pay | Admitting: Internal Medicine

## 2023-11-09 VITALS — BP 120/74 | HR 54 | Ht 63.5 in | Wt 185.2 lb

## 2023-11-09 DIAGNOSIS — R0609 Other forms of dyspnea: Secondary | ICD-10-CM | POA: Insufficient documentation

## 2023-11-09 DIAGNOSIS — R079 Chest pain, unspecified: Secondary | ICD-10-CM | POA: Diagnosis not present

## 2023-11-09 NOTE — Patient Instructions (Addendum)
 Medication Instructions:   Continue all current medications.   Labwork:  none  Testing/Procedures:  Your physician has requested that you have an exercise stress myoview . For further information please visit [email or web address] Please follow instruction sheet, as given.  Your physician has requested that you have an echocardiogram. Echocardiography is a painless test that uses sound waves to create images of your heart. It provides your doctor with information about the size and shape of your heart and how well your heart's chambers and valves are working. This procedure takes approximately one hour. There are no restrictions for this procedure. Please do NOT wear cologne, perfume, aftershave, or lotions (deodorant is allowed). Please arrive 15 minutes prior to your appointment time.  Please note: We ask at that you not bring children with you during ultrasound (echo/ vascular) testing. Due to room size and safety concerns, children are not allowed in the ultrasound rooms during exams. Our front office staff cannot provide observation of children in our lobby area while testing is being conducted. An adult accompanying a patient to their appointment will only be allowed in the ultrasound room at the discretion of the ultrasound technician under special circumstances. We apologize for any inconvenience.  Office will contact with results via phone, letter or mychart.     Follow-Up:  Pending test results   Any Other Special Instructions Will Be Listed Below (If Applicable).   If you need a refill on your cardiac medications before your next appointment, please call your pharmacy.

## 2023-11-09 NOTE — Progress Notes (Signed)
 Cardiology Office Note  Date: 11/09/2023   ID: Diane Graham, DOB 02-Oct-1950, MRN [id number]  PCP:  Bluford Jacqulyn MATSU, DO  Cardiologist:  None Electrophysiologist:  None   History of Present Illness: Diane Graham is a 73 y.o. female  Referred to cardiology clinic for evaluation of chest pain.  Chest pain occurred only twice.  First episode was when she was driving and second episode was when she was getting ready at home.  Lasted for 10 minutes.  Resolved spontaneously.  None with exertion.  Uses only 2 isolated episodes of chest when she has so far.  She also noticed getting short of breath more often than normal.  She notices more so when she was climbing stairs.  She also does yard work with her husband and does not get short of breath with this activity.  However when she walks uphill, she feels short of breath.  She also reported having constipation.  TSH was within normal limits and her free T4 was normal.  Past Medical History:  Diagnosis Date   Acoustic neuroma (HCC)    Deaf, right    Thyroid  disease     Past Surgical History:  Procedure Laterality Date   CHOLECYSTECTOMY  2022    Current Outpatient Medications  Medication Sig Dispense Refill   Docusate Sodium (DSS) 100 MG CAPS Take 100 mg by mouth 2 (two) times daily.     gabapentin  (NEURONTIN ) 300 MG capsule TAKE 1 CAPSULE BY MOUTH EVERYDAY AT BEDTIME 90 capsule 3   levothyroxine (SYNTHROID) 88 MCG tablet Take 88 mcg by mouth daily.     venlafaxine XR (EFFEXOR-XR) 37.5 MG 24 hr capsule Take 37.5 mg by mouth daily.     zolpidem  (AMBIEN ) 5 MG tablet Take 1 tablet (5 mg total) by mouth at bedtime as needed for sleep. 30 tablet 1   No current facility-administered medications for this visit.   Allergies:  Trazodone and nefazodone   Social History: The patient  reports that she has never smoked. She has never used smokeless tobacco. She reports that she does not currently use alcohol. She reports that she does not currently use  drugs.   Family History: The patient's family history includes Cancer in her father; Hyperlipidemia in her mother; Hypertension in her mother.   ROS:  Please see the history of present illness. Otherwise, complete review of systems is positive for none  All other systems are reviewed and negative.   Physical Exam: VS:  BP 120/74 (BP Location: Left Arm, Cuff Size: Normal)   Pulse (!) 54   Ht 5' 3.5 (1.613 m)   Wt 185 lb 3.2 oz (84 kg)   SpO2 99%   BMI 32.29 kg/m , BMI Body mass index is 32.29 kg/m.  Wt Readings from Last 3 Encounters:  11/09/23 185 lb 3.2 oz (84 kg)  08/25/23 187 lb (84.8 kg)  06/29/23 189 lb (85.7 kg)    General: Patient appears comfortable at rest. HEENT: Conjunctiva and lids normal, oropharynx clear with moist mucosa. Neck: Supple, no elevated JVP or carotid bruits, no thyromegaly. Lungs: Clear to auscultation, nonlabored breathing at rest. Cardiac: Regular rate and rhythm, no S3 or significant systolic murmur, no pericardial rub. Abdomen: Soft, nontender, no hepatomegaly, bowel sounds present, no guarding or rebound. Extremities: No pitting edema, distal pulses 2+. Skin: Warm and dry. Musculoskeletal: No kyphosis. Neuropsychiatric: Alert and oriented x3, affect grossly appropriate.  Recent Labwork: 04/21/2023: ALT 27; AST 24; BUN 11; Creatinine, Ser 0.91; Hemoglobin 14.1;  Platelets 296; Potassium 4.2; Sodium 138; TSH 5.670     Component Value Date/Time   CHOL 165 04/21/2023 1604   TRIG 209 (H) 04/21/2023 1604   HDL 47 04/21/2023 1604   CHOLHDL 3.5 04/21/2023 1604   LDLCALC 83 04/21/2023 1604    Assessment and Plan:  Chest pain - 2 episodes of chest pain that occurred while driving and getting ready.  Lasted for 10 minutes.  Not with exertion.  Otherwise active at baseline. - Due to advanced age, obtain exercise Myoview .  DOE - Noticed DOE especially with climbing stairs daily.  Does not work out.  Likely deconditioning.  Obtain echocardiogram to  rule out any structural heart disease and valvular dysfunction.  I reviewed records from PCP.  Reviewed more than 3 labs.  Medication Adjustments/Labs and Tests Ordered: Current medicines are reviewed at length with the patient today.  Concerns regarding medicines are outlined above.    Disposition:  Follow up pending results  Signed Mihaela Fajardo Priya Margart Zemanek, MD, 11/09/2023 3:40 PM    [facility] at [facility] [address] [place], [place], [place] [address]

## 2023-11-09 NOTE — Telephone Encounter (Signed)
 Checking percert on the following patient for testing scheduled at [facility].     exercise stress myoview .    11/16/2023

## 2023-11-16 ENCOUNTER — Encounter (HOSPITAL_COMMUNITY)
Admission: RE | Admit: 2023-11-16 | Discharge: 2023-11-16 | Disposition: A | Discharge status: Home / Self Care | Source: Ambulatory Visit | Attending: Internal Medicine | Admitting: Internal Medicine

## 2023-11-16 ENCOUNTER — Other Ambulatory Visit: Payer: Self-pay | Admitting: Physician Assistant

## 2023-11-16 ENCOUNTER — Encounter (HOSPITAL_COMMUNITY)

## 2023-11-16 ENCOUNTER — Ambulatory Visit: Payer: Self-pay | Admitting: Internal Medicine

## 2023-11-16 ENCOUNTER — Ambulatory Visit (HOSPITAL_COMMUNITY)
Admission: RE | Admit: 2023-11-16 | Discharge: 2023-11-16 | Disposition: A | Discharge status: Home / Self Care | Source: Ambulatory Visit | Attending: Internal Medicine | Admitting: Internal Medicine

## 2023-11-16 DIAGNOSIS — R079 Chest pain, unspecified: Secondary | ICD-10-CM | POA: Diagnosis present

## 2023-11-16 DIAGNOSIS — R0609 Other forms of dyspnea: Secondary | ICD-10-CM | POA: Insufficient documentation

## 2023-11-16 LAB — NM MYOCAR MULTI W/SPECT W/WALL MOTION / EF
Angina Index: 0
Base ST Depression (mm): 0 mm
Duke Treadmill Score: 4
Estimated workload: 6.5
Exercise duration (min): 3 min
Exercise duration (sec): 49 s
LV dias vol: 47 mL (ref 46–106)
LV sys vol: 13 mL (ref 3.8–5.2)
MPHR: 145 {beats}/min
Nuc Stress EF: 72 %
Peak HR: 150 {beats}/min
Percent HR: 103 %
RATE: 0.3
RPE: 12
Rest HR: 73 {beats}/min
Rest Nuclear Isotope Dose: 11 mCi
SDS: 4
SRS: 0
SSS: 4
ST Depression (mm): 0 mm
Stress Nuclear Isotope Dose: 30 mCi
TID: 1.14

## 2023-11-16 MED ORDER — SODIUM CHLORIDE FLUSH 0.9 % IV SOLN
INTRAVENOUS | Status: AC
  Filled 2023-11-16: qty 10

## 2023-11-16 MED ORDER — TECHNETIUM TC 99M TETROFOSMIN IV KIT
30.0000 | PACK | Freq: Once | INTRAVENOUS | Status: AC | PRN
  Administered 2023-11-16: 30 via INTRAVENOUS

## 2023-11-16 MED ORDER — REGADENOSON 0.4 MG/5ML IV SOLN
INTRAVENOUS | Status: AC
  Filled 2023-11-16: qty 5

## 2023-11-16 MED ORDER — TECHNETIUM TC 99M TETROFOSMIN IV KIT
10.0000 | PACK | Freq: Once | INTRAVENOUS | Status: AC | PRN
  Administered 2023-11-16: 11 via INTRAVENOUS

## 2023-11-16 NOTE — Progress Notes (Signed)
     Diane Graham presented for a Exercise Myoview  nuclear stress test today.  I Lorette CINDERELLA Kapur, PA-C, provided direct supervision and was present during the stress portion of the study today, which was completed without significant symptoms, immediate complications, or acute ST/T changes on ECG.  Stress imaging is pending at this time.  Preliminary ECG findings may be listed in the chart, but the stress test result will not be finalized until perfusion imaging is complete.  Lorette CINDERELLA Kapur, PA-C  11/16/2023, 11:54 AM

## 2023-11-17 ENCOUNTER — Ambulatory Visit: Payer: Self-pay

## 2023-11-17 NOTE — Telephone Encounter (Signed)
 FYI Only or Action Required?: FYI only for provider.  Patient was last seen in primary care on 08/25/2023 by Cook, Jayce G, DO.  Called Nurse Triage reporting No chief complaint on file..  Symptoms began about a month ago.  Interventions attempted: Rest, hydration, or home remedies.  Symptoms are: unchanged.  Triage Disposition: See PCP When Office is Open (Within 3 Days)  Patient/caregiver understands and will follow disposition?: Yes Reason for Disposition  [1] Fatigue (i.e., tires easily, decreased energy) AND [2] persists > 1 week  Answer Assessment - Initial Assessment Questions 1. DESCRIPTION: Describe how you are feeling.     Fatigued and craving ice   2. SEVERITY: How bad is it?  Can you stand and walk?     Ambulatory  3. ONSET: When did these symptoms begin? (e.g., hours, days, weeks, months)     One month  4. CAUSE: What do you think is causing the weakness or fatigue? (e.g., not drinking enough fluids, medical problem, trouble sleeping)     Would like labs  Protocols used: Weakness (Generalized) and Fatigue-A-AH Copied from CRM #[id number]. Topic: Clinical - Red Word Triage >> Nov 17, 2023 11:41 AM Tobias CROME wrote: Red Word that prompted transfer to Nurse Triage: extremely tired & craving ice

## 2023-11-18 ENCOUNTER — Ambulatory Visit: Admitting: Family Medicine

## 2023-11-18 VITALS — BP 113/79 | HR 76 | Temp 97.2°F | Ht 63.5 in | Wt 184.0 lb

## 2023-11-18 DIAGNOSIS — G2581 Restless legs syndrome: Secondary | ICD-10-CM

## 2023-11-18 DIAGNOSIS — M79604 Pain in right leg: Secondary | ICD-10-CM | POA: Diagnosis not present

## 2023-11-18 DIAGNOSIS — E781 Pure hyperglyceridemia: Secondary | ICD-10-CM

## 2023-11-18 DIAGNOSIS — E039 Hypothyroidism, unspecified: Secondary | ICD-10-CM

## 2023-11-18 DIAGNOSIS — F5089 Other specified eating disorder: Secondary | ICD-10-CM

## 2023-11-18 MED ORDER — NAPROXEN 500 MG PO TABS: 500.0000 mg | ORAL_TABLET | Freq: Two times a day (BID) | ORAL | 0 refills | Status: AC | PRN

## 2023-11-18 NOTE — Assessment & Plan Note (Signed)
 Tenderness over the Achilles consistent with Achilles tendinitis.  Naproxen .  Rest.

## 2023-11-18 NOTE — Patient Instructions (Addendum)
 Stress test was normal.  Medication as directed for your pain.  Labs today.  Follow up in 6 months

## 2023-11-18 NOTE — Assessment & Plan Note (Signed)
 Last TSH was elevated.  Obtaining TSH today.

## 2023-11-18 NOTE — Assessment & Plan Note (Signed)
 Patient endorsing pica and fatigue.  Assessing for iron deficiency anemia with labs today.

## 2023-11-18 NOTE — Progress Notes (Signed)
 Subjective:  Patient ID: Diane Diane Graham, female    DOB: September 10, 1950  Age: 73 y.o. MRN: [id number]  CC:   Chief Complaint  Patient presents with   craving ice    Concerned with iron levels, fatigue    HPI:  73 year old female presents for evaluation of the above.  Patient had a recent stress test which was negative.  Her blood pressure was elevated at the time but her blood pressure is well-controlled here today.  Patient reports a 1 month history of fatigue.  She states she has little energy.  She also states that she has been craving ice.  She is concerned that she has low iron levels and would like the labs to be obtained today.  Patient also reports that she has stopped gabapentin .  She states that a cousin of her developed an illness and died shortly after taking gabapentin .  She got scared and stopped the medication.  She has been prescribed this for restless leg.  She reports that she is having pain in her right lower extremity posteriorly at the Achilles.  Patient Active Problem List   Diagnosis Date Noted   Pica 11/18/2023   Pain of right lower extremity 11/18/2023   DOE (dyspnea on exertion) 11/09/2023   Insomnia 08/25/2023   RLS (restless legs syndrome) 06/29/2023   Hypothyroidism 04/21/2023   History of menopause 04/21/2023    Social Hx   Social History   Socioeconomic History   Marital status: Married    Spouse name: Not on file   Number of children: Not on file   Years of education: Not on file   Highest education level: Not on file  Occupational History   Not on file  Tobacco Use   Smoking status: Never   Smokeless tobacco: Never  Vaping Use   Vaping status: Never Used  Substance and Sexual Activity   Alcohol use: Not Currently   Drug use: Not Currently   Sexual activity: Yes  Other Topics Concern   Not on file  Social History Narrative   Not on file   Social Drivers of Health   Diane Diane Graham: Low Risk  (06/02/2023)   Overall Diane Graham  Resource Graham (CARDIA)    Difficulty of Paying Living Expenses: Not hard at all  Food Insecurity: No Food Insecurity (06/02/2023)   Hunger Vital Sign    Worried About Running Out of Food in the Last Year: Never true    Ran Out of Food in the Last Year: Never true  Transportation Needs: No Transportation Needs (06/02/2023)   PRAPARE - Administrator, Civil Service (Medical): No    Lack of Transportation (Non-Medical): No  Physical Activity: Inactive (06/02/2023)   Exercise Vital Sign    Days of Exercise per Week: 0 days    Minutes of Exercise per Session: 0 min  Stress: No Stress Concern Present (06/02/2023)   Diane Diane Graham    Feeling of Stress : Not at all  Social Connections: Socially Integrated (06/02/2023)   Social Connection and Isolation Panel    Frequency of Communication with Friends and Family: More than three times a week    Frequency of Social Gatherings with Friends and Family: More than three times a week    Attends Religious Services: More than 4 times per year    Active Member of Diane Diane Graham or Organizations: Yes    Attends Banker Meetings: More than 4 times per year  Marital Status: Married    Review of Systems Per HPI  Objective:  BP 113/79   Pulse 76   Temp (!) 97.2 F (36.2 C)   Ht 5' 3.5 (1.613 m)   Wt 184 lb (83.5 kg)   SpO2 98%   BMI 32.08 kg/m      11/18/2023    9:46 AM 11/09/2023    3:04 PM 08/25/2023   10:57 AM  BP/Weight  Systolic BP 113 120 125  Diastolic BP 79 74 76  Wt. (Lbs) 184 185.2 187  BMI 32.08 kg/m2 32.29 kg/m2 32.1 kg/m2    Physical Exam Vitals and nursing note reviewed.  Constitutional:      Appearance: Normal appearance.  Cardiovascular:     Rate and Rhythm: Normal rate and regular rhythm.  Pulmonary:     Effort: Pulmonary effort is normal.     Breath sounds: Normal breath sounds. No wheezing, rhonchi or rales.  Musculoskeletal:     Comments:  Mild tenderness over the distal Achilles of the right lower extremity.  Neurological:     Mental Status: She is alert.     Lab Results  Component Value Date   WBC 7.5 04/21/2023   HGB 14.1 04/21/2023   HCT 42.5 04/21/2023   PLT 296 04/21/2023   GLUCOSE 90 04/21/2023   CHOL 165 04/21/2023   TRIG 209 (H) 04/21/2023   HDL 47 04/21/2023   LDLCALC 83 04/21/2023   ALT 27 04/21/2023   AST 24 04/21/2023   NA 138 04/21/2023   K 4.2 04/21/2023   CL 100 04/21/2023   CREATININE 0.91 04/21/2023   BUN 11 04/21/2023   CO2 24 04/21/2023   TSH 5.670 (H) 04/21/2023   HGBA1C 5.5 04/21/2023     Assessment & Plan:  Pica Assessment & Plan: Patient endorsing pica and fatigue.  Assessing for iron deficiency anemia with labs today.  Orders: -     CBC -     Iron, TIBC and Ferritin Panel -     CMP14+EGFR  Hypothyroidism, unspecified type Assessment & Plan: Last TSH was elevated.  Obtaining TSH today.  Orders: -     TSH  Hypertriglyceridemia -     Lipid panel  Pain of right lower extremity Assessment & Plan: Tenderness over the Achilles consistent with Achilles tendinitis.  Naproxen .  Rest.  Orders: -     Naproxen ; Take 1 tablet (500 mg total) by mouth 2 (two) times daily as needed for moderate pain (pain score 4-6).  Dispense: 30 tablet; Refill: 0  RLS (restless legs syndrome) Assessment & Plan: Advised patient that she should restart gabapentin .     Follow-up: 6 months  Elleni Mozingo Bluford DO [facility]

## 2023-11-18 NOTE — Assessment & Plan Note (Signed)
 Advised patient that she should restart gabapentin .

## 2023-11-19 LAB — CMP14+EGFR
ALT: 14 IU/L (ref 0–32)
AST: 20 IU/L (ref 0–40)
Albumin: 4.3 g/dL (ref 3.8–4.8)
Alkaline Phosphatase: 100 IU/L (ref 44–121)
BUN/Creatinine Ratio: 15 (ref 12–28)
BUN: 13 mg/dL (ref 8–27)
Bilirubin Total: 0.4 mg/dL (ref 0.0–1.2)
CO2: 21 mmol/L (ref 20–29)
Calcium: 9.8 mg/dL (ref 8.7–10.3)
Chloride: 103 mmol/L (ref 96–106)
Creatinine, Ser: 0.88 mg/dL (ref 0.57–1.00)
Globulin, Total: 2.6 g/dL (ref 1.5–4.5)
Glucose: 88 mg/dL (ref 70–99)
Potassium: 4.7 mmol/L (ref 3.5–5.2)
Sodium: 139 mmol/L (ref 134–144)
Total Protein: 6.9 g/dL (ref 6.0–8.5)
eGFR: 68 mL/min/1.73 (ref >59)

## 2023-11-19 LAB — CBC
Hematocrit: 40.7 % (ref 34.0–46.6)
Hemoglobin: 12.2 g/dL (ref 11.1–15.9)
MCH: 25.2 pg — ABNORMAL LOW (ref 26.6–33.0)
MCHC: 30 g/dL — ABNORMAL LOW (ref 31.5–35.7)
MCV: 84 fL (ref 79–97)
Platelets: 367 x10E3/uL (ref 150–450)
RBC: 4.84 x10E6/uL (ref 3.77–5.28)
RDW: 17.1 % — ABNORMAL HIGH (ref 11.7–15.4)
WBC: 6.7 x10E3/uL (ref 3.4–10.8)

## 2023-11-19 LAB — LIPID PANEL
Chol/HDL Ratio: 3.6 ratio (ref 0.0–4.4)
Cholesterol, Total: 200 mg/dL — ABNORMAL HIGH (ref 100–199)
HDL: 56 mg/dL (ref >39)
LDL Chol Calc (NIH): 115 mg/dL — ABNORMAL HIGH (ref 0–99)
Triglycerides: 168 mg/dL — ABNORMAL HIGH (ref 0–149)
VLDL Cholesterol Cal: 29 mg/dL (ref 5–40)

## 2023-11-19 LAB — IRON,TIBC AND FERRITIN PANEL
Ferritin: 14 ng/mL — ABNORMAL LOW (ref 15–150)
Iron Saturation: 12 % — ABNORMAL LOW (ref 15–55)
Iron: 52 ug/dL (ref 27–139)
Total Iron Binding Capacity: 451 ug/dL — ABNORMAL HIGH (ref 250–450)
UIBC: 399 ug/dL — ABNORMAL HIGH (ref 118–369)

## 2023-11-19 LAB — TSH: TSH: 3.55 u[IU]/mL (ref 0.450–4.500)

## 2023-11-22 ENCOUNTER — Other Ambulatory Visit: Payer: Self-pay | Admitting: Family Medicine

## 2023-11-22 ENCOUNTER — Ambulatory Visit: Payer: Self-pay | Admitting: Family Medicine

## 2023-11-22 MED ORDER — IRON (FERROUS SULFATE) 325 (65 FE) MG PO TABS
325.0000 mg | ORAL_TABLET | ORAL | 1 refills | Status: AC
Start: 2023-11-22 — End: ?

## 2023-12-06 ENCOUNTER — Other Ambulatory Visit

## 2023-12-13 ENCOUNTER — Telehealth: Payer: Self-pay | Admitting: Internal Medicine

## 2023-12-13 NOTE — Telephone Encounter (Signed)
 Patient states that she was told that if her stress test was normal that she would not need the Echo. She would like to verify with Dr. Mallipeddi . Would like a call back after Dr. Mallipeddi advises.

## 2023-12-15 ENCOUNTER — Telehealth: Payer: Self-pay

## 2023-12-15 DIAGNOSIS — R140 Abdominal distension (gaseous): Secondary | ICD-10-CM

## 2023-12-15 DIAGNOSIS — K5900 Constipation, unspecified: Secondary | ICD-10-CM

## 2023-12-15 NOTE — Telephone Encounter (Signed)
 Copied from CRM [id number]. Topic: Referral - Request for Referral >> Dec 14, 2023  1:58 PM Anairis L wrote: Did the patient discuss referral with their provider in the last year? No (If No - schedule appointment) (If Yes - send message)  Appointment offered? No  Type of order/referral and detailed reason for visit: GI  Preference of office, provider, location: [facility]  If referral order, have you been seen by this specialty before? Yes (If Yes, this issue or another issue? When? Where?Yes, [facility] in [address]  Can we respond through MyChart? Yes  Please give her a call

## 2023-12-16 NOTE — Telephone Encounter (Signed)
 Left message requesting additional information.

## 2023-12-20 NOTE — Addendum Note (Signed)
 Addended by: Joniece Smotherman M on: 12/20/2023 04:35 PM   Modules accepted: Orders

## 2023-12-22 ENCOUNTER — Telehealth: Payer: Self-pay | Admitting: Pharmacy Technician

## 2023-12-22 ENCOUNTER — Telehealth: Payer: Self-pay | Admitting: *Deleted

## 2023-12-22 ENCOUNTER — Other Ambulatory Visit (HOSPITAL_COMMUNITY): Payer: Self-pay

## 2023-12-22 NOTE — Telephone Encounter (Signed)
 Copied from CRM #[id number]. Topic: Clinical - Medication Question >> Dec 22, 2023  8:55 AM Diane Graham wrote: Reason for CRM: Patient is waiting for prior auth for zolpidem  (AMBIEN ) 5 MG tablet. Is asking if there is anything over the counter she can try to take that will help while she waits.  Patient can be reached at [phone]

## 2023-12-22 NOTE — Telephone Encounter (Signed)
 Pharmacy Patient Advocate Encounter  Received notification from [facility] MEDIC[employer] that Prior Authorization for Zolpidem  Tartrate 5MG  tablets has been DENIED.  Full denial letter will be uploaded to the media tab. See denial reason below.   PA #/Case ID/Reference #: [id number]

## 2023-12-22 NOTE — Telephone Encounter (Signed)
 Pharmacy Patient Advocate Encounter   Received notification from Pt Calls Messages that prior authorization for Zolpidem  Tartrate 5MG  tablets is required/requested.   Insurance verification completed.   The patient is insured through [facility] MEDICARE .   Per test claim: PA required; PA submitted to above mentioned insurance via Latent Key/confirmation #/EOC [id number] Status is pending

## 2023-12-22 NOTE — Telephone Encounter (Signed)
 Copied from CRM #[id number]. Topic: Clinical - Medication Prior Auth >> Dec 22, 2023  8:52 AM Diane Graham wrote: Reason for CRM: Patient states the pharmacy will not refill her prescription for zolpidem  (AMBIEN ) 5 MG tablet. States the insurance is requiring prior authorization.  Patient can be reached at [phone]

## 2023-12-22 NOTE — Telephone Encounter (Signed)
 PA request has been Started. New Encounter has been or will be created for follow up. For additional info see Pharmacy Prior Auth telephone encounter from 12/22/2023.

## 2023-12-23 NOTE — Telephone Encounter (Signed)
 Cook, Jayce G, DO      12/22/23  5:00 PM Insurance denied. What has she tried in the past?

## 2024-01-03 ENCOUNTER — Ambulatory Visit: Attending: Internal Medicine

## 2024-01-03 DIAGNOSIS — R079 Chest pain, unspecified: Secondary | ICD-10-CM | POA: Diagnosis not present

## 2024-01-03 LAB — ECHOCARDIOGRAM COMPLETE
AR max vel: 2.8 cm2
AV Peak grad: 6.8 mmHg
Ao pk vel: 1.3 m/s
Area-P 1/2: 3.27 cm2
Calc EF: 67.5 %
S' Lateral: 2.4 cm
Single Plane A2C EF: 60.9 %
Single Plane A4C EF: 72.7 %

## 2024-01-10 NOTE — Telephone Encounter (Signed)
-----   Message from Vishnu P Mallipeddi sent at 01/07/2024 11:50 AM EDT ----- Normal LV function, normal RV function, moderate PR, CVP 3 mmHg.  Overall normal echocardiogram except for moderate pulm regurgitation for which echo will be performed in 1 year.  Schedule follow-up  in 1 year with repeat echocardiogram prior to next follow-up visit.  DOE likely from deconditioning. ----- Message ----- From: Interface, Three One Seven Sent: 01/03/2024   3:01 PM EDT To: Vishnu P Mallipeddi, MD

## 2024-01-10 NOTE — Telephone Encounter (Signed)
 The patient has been notified of the result and verbalized understanding.  All questions (if any) were answered. Littie CHRISTELLA Croak, CMA 01/10/2024 4:35 PM

## 2024-02-14 ENCOUNTER — Ambulatory Visit: Admitting: Family Medicine

## 2024-04-21 ENCOUNTER — Ambulatory Visit: Payer: Medicare HMO | Admitting: Internal Medicine

## 2024-06-06 ENCOUNTER — Ambulatory Visit

## 2025-01-09 ENCOUNTER — Ambulatory Visit
